# Patient Record
Sex: Female | Born: 1950 | Race: White | Hispanic: No | Smoking: Former smoker
Health system: Southern US, Community
[De-identification: ages and names within clinical notes are randomized; demographics above are authoritative.]

## PROBLEM LIST (undated history)

## (undated) DIAGNOSIS — H269 Unspecified cataract: Secondary | ICD-10-CM

## (undated) DIAGNOSIS — F329 Major depressive disorder, single episode, unspecified: Secondary | ICD-10-CM

## (undated) DIAGNOSIS — J349 Unspecified disorder of nose and nasal sinuses: Secondary | ICD-10-CM

## (undated) DIAGNOSIS — K635 Polyp of colon: Secondary | ICD-10-CM

## (undated) DIAGNOSIS — C801 Malignant (primary) neoplasm, unspecified: Secondary | ICD-10-CM

## (undated) DIAGNOSIS — E119 Type 2 diabetes mellitus without complications: Secondary | ICD-10-CM

## (undated) DIAGNOSIS — E079 Disorder of thyroid, unspecified: Secondary | ICD-10-CM

## (undated) DIAGNOSIS — F32A Depression, unspecified: Secondary | ICD-10-CM

## (undated) DIAGNOSIS — C44300 Unspecified malignant neoplasm of skin of unspecified part of face: Secondary | ICD-10-CM

## (undated) DIAGNOSIS — Z923 Personal history of irradiation: Secondary | ICD-10-CM

## (undated) DIAGNOSIS — D369 Benign neoplasm, unspecified site: Secondary | ICD-10-CM

## (undated) DIAGNOSIS — F419 Anxiety disorder, unspecified: Secondary | ICD-10-CM

## (undated) DIAGNOSIS — M1990 Unspecified osteoarthritis, unspecified site: Secondary | ICD-10-CM

## (undated) DIAGNOSIS — Z8489 Family history of other specified conditions: Secondary | ICD-10-CM

## (undated) DIAGNOSIS — G4730 Sleep apnea, unspecified: Secondary | ICD-10-CM

## (undated) DIAGNOSIS — H35049 Retinal micro-aneurysms, unspecified, unspecified eye: Secondary | ICD-10-CM

## (undated) DIAGNOSIS — E559 Vitamin D deficiency, unspecified: Secondary | ICD-10-CM

## (undated) DIAGNOSIS — E785 Hyperlipidemia, unspecified: Secondary | ICD-10-CM

## (undated) HISTORY — DX: Personal history of irradiation: Z92.3

## (undated) HISTORY — DX: Retinal micro-aneurysms, unspecified, unspecified eye: H35.049

## (undated) HISTORY — DX: Unspecified malignant neoplasm of skin of unspecified part of face: C44.300

## (undated) HISTORY — DX: Unspecified osteoarthritis, unspecified site: M19.90

## (undated) HISTORY — PX: TONSILECTOMY, ADENOIDECTOMY, BILATERAL MYRINGOTOMY AND TUBES: SHX2538

## (undated) HISTORY — PX: GASTRIC BYPASS: SHX52

## (undated) HISTORY — PX: ABDOMINAL HYSTERECTOMY: SHX81

## (undated) HISTORY — DX: Disorder of thyroid, unspecified: E07.9

## (undated) HISTORY — PX: MOHS SURGERY: SUR867

## (undated) HISTORY — DX: Benign neoplasm, unspecified site: D36.9

## (undated) HISTORY — PX: OTHER SURGICAL HISTORY: SHX169

## (undated) HISTORY — DX: Unspecified disorder of nose and nasal sinuses: J34.9

## (undated) HISTORY — DX: Polyp of colon: K63.5

## (undated) HISTORY — DX: Vitamin D deficiency, unspecified: E55.9

## (undated) HISTORY — DX: Hyperlipidemia, unspecified: E78.5

## (undated) HISTORY — PX: TUBAL LIGATION: SHX77

## (undated) HISTORY — DX: Unspecified cataract: H26.9

## (undated) HISTORY — PX: THYROIDECTOMY: SHX17

## (undated) HISTORY — DX: Type 2 diabetes mellitus without complications: E11.9

## (undated) HISTORY — PX: NASAL SINUS SURGERY: SHX719

---

## 1997-09-04 ENCOUNTER — Ambulatory Visit: Admission: RE | Admit: 1997-09-04 | Discharge: 1997-09-04 | Payer: Self-pay | Admitting: Otolaryngology

## 1997-10-09 ENCOUNTER — Observation Stay (HOSPITAL_COMMUNITY): Admission: RE | Admit: 1997-10-09 | Discharge: 1997-10-10 | Payer: Self-pay | Admitting: Otolaryngology

## 1998-02-15 HISTORY — PX: TONSILLECTOMY: SUR1361

## 2000-06-15 ENCOUNTER — Encounter: Admission: RE | Admit: 2000-06-15 | Discharge: 2000-09-13 | Payer: Self-pay | Admitting: *Deleted

## 2002-08-01 ENCOUNTER — Encounter: Payer: Self-pay | Admitting: Internal Medicine

## 2002-08-01 ENCOUNTER — Encounter: Admission: RE | Admit: 2002-08-01 | Discharge: 2002-08-01 | Payer: Self-pay | Admitting: Internal Medicine

## 2002-08-17 ENCOUNTER — Encounter (INDEPENDENT_AMBULATORY_CARE_PROVIDER_SITE_OTHER): Payer: Self-pay | Admitting: *Deleted

## 2002-08-17 ENCOUNTER — Ambulatory Visit (HOSPITAL_COMMUNITY): Admission: RE | Admit: 2002-08-17 | Discharge: 2002-08-17 | Payer: Self-pay | Admitting: Gastroenterology

## 2003-10-26 ENCOUNTER — Observation Stay (HOSPITAL_COMMUNITY): Admission: EM | Admit: 2003-10-26 | Discharge: 2003-10-27 | Payer: Self-pay | Admitting: Emergency Medicine

## 2004-04-01 ENCOUNTER — Encounter: Admission: RE | Admit: 2004-04-01 | Discharge: 2004-04-01 | Payer: Self-pay | Admitting: Internal Medicine

## 2004-07-23 ENCOUNTER — Encounter: Admission: RE | Admit: 2004-07-23 | Discharge: 2004-07-23 | Payer: Self-pay | Admitting: Internal Medicine

## 2004-07-31 ENCOUNTER — Encounter: Admission: RE | Admit: 2004-07-31 | Discharge: 2004-07-31 | Payer: Self-pay | Admitting: Internal Medicine

## 2004-08-05 ENCOUNTER — Encounter: Admission: RE | Admit: 2004-08-05 | Discharge: 2004-08-05 | Payer: Self-pay | Admitting: Internal Medicine

## 2004-08-06 ENCOUNTER — Encounter: Admission: RE | Admit: 2004-08-06 | Discharge: 2004-08-06 | Payer: Self-pay | Admitting: Internal Medicine

## 2004-09-17 ENCOUNTER — Encounter: Admission: RE | Admit: 2004-09-17 | Discharge: 2004-09-17 | Payer: Self-pay | Admitting: Internal Medicine

## 2004-10-27 ENCOUNTER — Encounter: Admission: RE | Admit: 2004-10-27 | Discharge: 2004-10-27 | Payer: Self-pay | Admitting: Internal Medicine

## 2007-04-21 ENCOUNTER — Emergency Department (HOSPITAL_COMMUNITY): Admission: EM | Admit: 2007-04-21 | Discharge: 2007-04-22 | Payer: Self-pay | Admitting: Emergency Medicine

## 2009-09-10 ENCOUNTER — Ambulatory Visit (HOSPITAL_COMMUNITY): Admission: RE | Admit: 2009-09-10 | Discharge: 2009-09-10 | Payer: Self-pay | Admitting: Surgery

## 2009-09-11 ENCOUNTER — Ambulatory Visit (HOSPITAL_COMMUNITY): Admission: RE | Admit: 2009-09-11 | Discharge: 2009-09-11 | Payer: Self-pay | Admitting: Surgery

## 2009-09-30 ENCOUNTER — Encounter: Admission: RE | Admit: 2009-09-30 | Discharge: 2009-11-13 | Payer: Self-pay | Admitting: Surgery

## 2009-09-30 ENCOUNTER — Ambulatory Visit (HOSPITAL_COMMUNITY): Admission: RE | Admit: 2009-09-30 | Discharge: 2009-09-30 | Payer: Self-pay | Admitting: Surgery

## 2009-11-13 ENCOUNTER — Inpatient Hospital Stay (HOSPITAL_COMMUNITY)
Admission: RE | Admit: 2009-11-13 | Discharge: 2009-11-16 | Payer: Self-pay | Source: Home / Self Care | Admitting: Surgery

## 2009-11-14 ENCOUNTER — Ambulatory Visit: Payer: Self-pay | Admitting: Vascular Surgery

## 2009-11-14 ENCOUNTER — Encounter (INDEPENDENT_AMBULATORY_CARE_PROVIDER_SITE_OTHER): Payer: Self-pay | Admitting: Surgery

## 2009-11-25 ENCOUNTER — Encounter
Admission: RE | Admit: 2009-11-25 | Discharge: 2010-01-20 | Payer: Self-pay | Source: Home / Self Care | Attending: Surgery | Admitting: Surgery

## 2010-02-24 ENCOUNTER — Encounter
Admission: RE | Admit: 2010-02-24 | Discharge: 2010-03-17 | Payer: Self-pay | Source: Home / Self Care | Attending: Surgery | Admitting: Surgery

## 2010-03-08 ENCOUNTER — Encounter: Payer: Self-pay | Admitting: Surgery

## 2010-03-08 ENCOUNTER — Encounter: Payer: Self-pay | Admitting: Internal Medicine

## 2010-04-23 ENCOUNTER — Encounter: Payer: PRIVATE HEALTH INSURANCE | Attending: Surgery | Admitting: *Deleted

## 2010-04-23 DIAGNOSIS — Z09 Encounter for follow-up examination after completed treatment for conditions other than malignant neoplasm: Secondary | ICD-10-CM | POA: Insufficient documentation

## 2010-04-23 DIAGNOSIS — Z713 Dietary counseling and surveillance: Secondary | ICD-10-CM | POA: Insufficient documentation

## 2010-04-23 DIAGNOSIS — Z9884 Bariatric surgery status: Secondary | ICD-10-CM | POA: Insufficient documentation

## 2010-04-30 LAB — DIFFERENTIAL
Basophils Absolute: 0 10*3/uL (ref 0.0–0.1)
Basophils Absolute: 0.1 10*3/uL (ref 0.0–0.1)
Eosinophils Absolute: 0.1 10*3/uL (ref 0.0–0.7)
Eosinophils Absolute: 0 10*3/uL (ref 0.0–0.7)
Eosinophils Relative: 0 % (ref 0–5)
Eosinophils Relative: 2 % (ref 0–5)
Lymphocytes Relative: 19 % (ref 12–46)
Lymphocytes Relative: 34 % (ref 12–46)
Lymphs Abs: 2.1 10*3/uL (ref 0.7–4.0)
Lymphs Abs: 1.7 10*3/uL (ref 0.7–4.0)
Lymphs Abs: 2.3 10*3/uL (ref 0.7–4.0)
Monocytes Absolute: 0.6 10*3/uL (ref 0.1–1.0)
Monocytes Absolute: 0.6 10*3/uL (ref 0.1–1.0)
Monocytes Relative: 8 % (ref 3–12)
Monocytes Relative: 8 % (ref 3–12)
Monocytes Relative: 8 % (ref 3–12)
Neutrophils Relative %: 63 % (ref 43–77)
Neutrophils Relative %: 73 % (ref 43–77)

## 2010-04-30 LAB — GLUCOSE, CAPILLARY
Glucose-Capillary: 114 mg/dL — ABNORMAL HIGH (ref 70–99)
Glucose-Capillary: 120 mg/dL — ABNORMAL HIGH (ref 70–99)
Glucose-Capillary: 123 mg/dL — ABNORMAL HIGH (ref 70–99)
Glucose-Capillary: 127 mg/dL — ABNORMAL HIGH (ref 70–99)
Glucose-Capillary: 119 mg/dL — ABNORMAL HIGH (ref 70–99)
Glucose-Capillary: 121 mg/dL — ABNORMAL HIGH (ref 70–99)
Glucose-Capillary: 125 mg/dL — ABNORMAL HIGH (ref 70–99)
Glucose-Capillary: 130 mg/dL — ABNORMAL HIGH (ref 70–99)
Glucose-Capillary: 132 mg/dL — ABNORMAL HIGH (ref 70–99)
Glucose-Capillary: 137 mg/dL — ABNORMAL HIGH (ref 70–99)
Glucose-Capillary: 139 mg/dL — ABNORMAL HIGH (ref 70–99)
Glucose-Capillary: 145 mg/dL — ABNORMAL HIGH (ref 70–99)
Glucose-Capillary: 149 mg/dL — ABNORMAL HIGH (ref 70–99)
Glucose-Capillary: 155 mg/dL — ABNORMAL HIGH (ref 70–99)
Glucose-Capillary: 159 mg/dL — ABNORMAL HIGH (ref 70–99)
Glucose-Capillary: 164 mg/dL — ABNORMAL HIGH (ref 70–99)
Glucose-Capillary: 172 mg/dL — ABNORMAL HIGH (ref 70–99)
Glucose-Capillary: 181 mg/dL — ABNORMAL HIGH (ref 70–99)

## 2010-04-30 LAB — CBC
HCT: 37.6 % (ref 36.0–46.0)
Hemoglobin: 11.3 g/dL — ABNORMAL LOW (ref 12.0–15.0)
Hemoglobin: 12.7 g/dL (ref 12.0–15.0)
MCH: 30.2 pg (ref 26.0–34.0)
MCH: 30.3 pg (ref 26.0–34.0)
MCHC: 34 g/dL (ref 30.0–36.0)
MCHC: 33.8 g/dL (ref 30.0–36.0)
MCV: 89.1 fL (ref 78.0–100.0)
MCV: 89.5 fL (ref 78.0–100.0)
Platelets: 315 10*3/uL (ref 150–400)
RBC: 3.74 MIL/uL — ABNORMAL LOW (ref 3.87–5.11)
RBC: 4.2 MIL/uL (ref 3.87–5.11)
RDW: 14.4 % (ref 11.5–15.5)
RDW: 13.8 % (ref 11.5–15.5)
WBC: 7 10*3/uL (ref 4.0–10.5)
WBC: 8.9 10*3/uL (ref 4.0–10.5)

## 2010-04-30 LAB — COMPREHENSIVE METABOLIC PANEL
AST: 18 U/L (ref 0–37)
Albumin: 4 g/dL (ref 3.5–5.2)
Chloride: 105 mEq/L (ref 96–112)
Creatinine, Ser: 0.58 mg/dL (ref 0.4–1.2)
GFR calc Af Amer: >60 mL/min (ref >60)
Total Bilirubin: 0.8 mg/dL (ref 0.3–1.2)

## 2010-04-30 LAB — SURGICAL PCR SCREEN
MRSA, PCR: NEGATIVE
Staphylococcus aureus: NEGATIVE

## 2010-04-30 LAB — HEMOGLOBIN AND HEMATOCRIT, BLOOD
HCT: 32.8 % — ABNORMAL LOW (ref 36.0–46.0)
HCT: 33 % — ABNORMAL LOW (ref 36.0–46.0)
Hemoglobin: 11.2 g/dL — ABNORMAL LOW (ref 12.0–15.0)
Hemoglobin: 11.2 g/dL — ABNORMAL LOW (ref 12.0–15.0)

## 2010-07-03 NOTE — Op Note (Signed)
Sherri Adams                          ACCOUNT NO.:  [id number]   MEDICAL RECORD NO.:  [id number]                   PATIENT TYPE:  OBV   LOCATION:  1823                                 FACILITY:  [facility]   PHYSICIAN:  Katy Fitch. Naaman Plummer., M.D.          DATE OF BIRTH:  01/26/1951   DATE OF PROCEDURE:  10/26/2003  DATE OF DISCHARGE:                                 OPERATIVE REPORT   PREOPERATIVE DIAGNOSIS:  Comminuted intra-articular fracture of right distal  radius, closed, displaced.   POSTOPERATIVE DIAGNOSIS:  Comminuted intra-articular fracture of right  distal radius, closed, displaced.   OPERATION:  Open reduction internal fixation of right radius utilizing 7 peg  DVR plate system.   OPERATING SURGEON:  Katy Fitch. Sypher, M.D., no assistant.   ANESTHESIA:  General by endotracheal technique; anesthesiologist is Dr.  Krista Blue.   INDICATIONS:  Sherri Adams is a 60 year old right hand dominant woman  referred by Dr. Ike Bene of the [facility] for  evaluation and management of a comminuted intra-articular fracture of the  right distal radius.   Reportedly, she fell at home earlier today while descending stairs. She  landed full weight bearing onto her hand and face. She had a minor abrasion  to her right cheek. She did struck her head but did not loose consciousness.   She was transported to the [facility] emergency room where she was evaluated  by Dr. Weldon Inches who made the diagnosis of a probable wrist fracture. X-rays  of her hand, wrist, forearm, and elbow region reportedly revealed a  comminuted displaced intra-articular fracture of the right distal radius.   An upper extremity orthopedic consult was requested.   After informed consent with Sherri Adams, her son and her sister who is a  Designer, jewellery, we recommended proceeding with open reduction internal  fixation of her wrist at this time.   The advantages of this procedure are obtaining  and maintaining anatomic  reduction of her radius as well as allowing her to mobilize her wrist in an  early fashion and avoiding the need for prolonged casting with the risks  attendant to prolonged casting.   After questions were invited and answered, she was brought to the operating  room at this time.   PROCEDURE:  Sherri Adams was brought to the operating room and placed in a  supine position on the operating room table.   Following the induction of general orotracheal anesthesia, the right arm was  prepped with Betadine solution and sterilely draped. A pneumatic tourniquet  was applied to proximal right brachium.   One gram of Ancef was administered as an IV prophylactic antibiotic.   Following exsanguination of the right arm with an Esmarch bandage, the  arterial tourniquet over the proximal brachium was inflated to 250 mmHg.   The procedure commenced with a standard DVR incision, paralleling the path  of the flexor carpal radialis tendon.  Subcutaneous tissues were carefully divided, taking care to identify the  transverse veins. These were clipped and suture ligated.   The flexor carpi radialis was retracted in an ulnar direction and the radial  artery and its accompanying veins in a radial direction. The fascia at the  floor of the FCR sheath was released, and the flexor pollicis longus  retracted in an ulnar direction.   The pronator quadratus was then elevated, exposing the fracture site.   The fracture was moderately comminuted. A locking bone clamp was used to  grasp the proximal shaft, rotated 90 degrees, and then elevator was used to  disimpact the articular fracture fragments.   The volar cortex was then anatomically reduced under direct vision.   A 7 peg DVR plate was placed with provisional fixation, and C-arm  fluoroscopy revealed plate to be slightly distal. Therefore, this was  readjusted into a more acceptable position.   A 12-mm screw was placed in  the cortex over the radial metaphysis to secure  the plate followed by placement of 7 threaded and smooth pegs. AP, lateral,  and oblique C-arm images were obtained during peg placement to control  length and proper position relative to the articular surface.   An anatomic reduction of the fracture was achieved, and good fixation  achieved with the pegs.   The remaining cortical screws were then filled with 12-mm screws x3 and the  10 mm in the most proximal hole.   The wound was then thoroughly lavaged with sterile saline followed by repair  of the pronator quadratus with a running suture of 0 Vicryl followed by  repair of the skin with subdermal sutures of 4-0 Vicryl and intradermal 3-0  Prolene with sterile strips.   The tourniquet was released for a total tourniquet time of approximately 54  minutes at 250 mmHg. Sherri Adams was then placed in a voluminous gauze and  sterile Webril dressing with sugar tong splint, maintaining the forearm in a  neutral rotation.   There were no apparent complications.   Sherri Adams tolerated the surgery and anesthesia well. She was transferred to  recovery room with stable vital signs.   Due to a background history of type 2 diabetes managed on metformin and  Actos as well as a history of asthma and sleep apnea, she will be observed  for 24 hours in the hospital to monitor vital signs and pulse oximetry  p.r.n.   We anticipate discharge with prescriptions for Percocet 5 mg 1or 2 tablets  p.o. q.4-6h. p.r.n. pain 30 tablets without refill. Also Keflex 500 mg 1  p.o. q.8h. for 4 days for prophylactic antibiotic and Motrin 600 mg 1 p.o.  q.6h. p.r.n. pain 30 tablets with 2 refills.                                               Katy Fitch Naaman Plummer., M.D.    RVS/MEDQ  D:  10/26/2003  T:  10/27/2003  Job:  [id number]   cc:   Ike Bene, M.D.  [address] [place]  [place] [address]  Fax: [phone]

## 2010-07-03 NOTE — Op Note (Signed)
   [name], Sherri Adams                          ACCOUNT NO.:  [id number]   MEDICAL RECORD NO.:  [id number]                   PATIENT TYPE:  AMB   LOCATION:  ENDO                                 FACILITY:  [facility]   PHYSICIAN:  Danise Edge, M.D.                DATE OF BIRTH:  August 23, 1950   DATE OF PROCEDURE:  08/17/2002  DATE OF DISCHARGE:                                 OPERATIVE REPORT   PROCEDURE:  Colonoscopy and polypectomy.   INDICATIONS:  Sherri Adams is a 60 year old female born February 07, 1951. Approximately  6 years ago Sherri Adams underwent  a colonoscopy and  neoplastic polyps were removed. Sherri Adams is scheduled to undergo a  screening colonoscopy with polypectomy to prevent colon cancer.   ENDOSCOPIST:  Danise Edge, M.D.   PREMEDICATION:  1. Versed 5 mg.  2. Demerol 50 mg.   DESCRIPTION OF PROCEDURE:  After informed consent was obtained Sherri Adams was  placed in the left lateral decubitus position. I administered intravenous  Demerol and intravenous Versed to achieve conscious sedation for the  procedure. The patient's blood pressure, O2 saturation and cardiac rhythm  were monitored throughout the procedure and documented in the medical  record.   Anal inspection was normal. Digital rectal exam was normal. The Olympus  pediatric video colonoscope was introduced into the rectum and advanced to  the cecum. Colonic preparation for the examination today was excellent.   Rectum:  Normal.  Sigmoid colon and descending colon:  At approximately  20 cm from the anal  verge, a  1-mm sessile polyp  was removed with the hot biopsy forceps.  Splenic flexure:  Normal.  Transverse colon:  Normal.  Hepatic flexure:  Normal.  Ascending colon:  Normal.  Cecum and ileocecal valve:  Normal.   ASSESSMENT:  A small  polyp  was removed from the distal sigmoid colon with  the hot biopsy forceps.   RECOMMENDATIONS:  Repeat colonoscopy in approximately 5 years.                                          Danise Edge, M.D.    MJ/MEDQ  D:  08/17/2002  T:  08/18/2002  Job:  [id number]

## 2010-07-21 ENCOUNTER — Ambulatory Visit: Payer: PRIVATE HEALTH INSURANCE | Admitting: *Deleted

## 2010-07-24 ENCOUNTER — Encounter: Payer: PRIVATE HEALTH INSURANCE | Attending: Surgery | Admitting: *Deleted

## 2010-07-24 DIAGNOSIS — Z9884 Bariatric surgery status: Secondary | ICD-10-CM | POA: Insufficient documentation

## 2010-07-24 DIAGNOSIS — Z09 Encounter for follow-up examination after completed treatment for conditions other than malignant neoplasm: Secondary | ICD-10-CM | POA: Insufficient documentation

## 2010-07-24 DIAGNOSIS — Z713 Dietary counseling and surveillance: Secondary | ICD-10-CM | POA: Insufficient documentation

## 2010-09-30 ENCOUNTER — Encounter (INDEPENDENT_AMBULATORY_CARE_PROVIDER_SITE_OTHER): Payer: Self-pay | Admitting: General Surgery

## 2010-10-02 ENCOUNTER — Encounter (INDEPENDENT_AMBULATORY_CARE_PROVIDER_SITE_OTHER): Payer: Self-pay | Admitting: Surgery

## 2010-10-02 ENCOUNTER — Ambulatory Visit (INDEPENDENT_AMBULATORY_CARE_PROVIDER_SITE_OTHER): Payer: PRIVATE HEALTH INSURANCE | Admitting: Surgery

## 2010-10-02 VITALS — BP 124/82 | HR 64 | Temp 97.4°F | Resp 18 | Ht 62.0 in | Wt 268.2 lb

## 2010-10-02 DIAGNOSIS — Z9884 Bariatric surgery status: Secondary | ICD-10-CM

## 2010-10-02 DIAGNOSIS — E119 Type 2 diabetes mellitus without complications: Secondary | ICD-10-CM

## 2010-10-02 DIAGNOSIS — I10 Essential (primary) hypertension: Secondary | ICD-10-CM

## 2010-10-02 DIAGNOSIS — J45909 Unspecified asthma, uncomplicated: Secondary | ICD-10-CM | POA: Insufficient documentation

## 2010-10-02 DIAGNOSIS — E7800 Pure hypercholesterolemia, unspecified: Secondary | ICD-10-CM | POA: Insufficient documentation

## 2010-10-02 NOTE — Progress Notes (Signed)
Sherri Adams is 10.7 months post op and has lost 82 lbs or 34% of her excess weight.  She looks and feels good.  She is off insulin shots and now only takes oral meds. She works out at [employer] 3 times a week. Her incisions have healed nicely. I will see her back in the office in 6 months. Dr. Trula Slade he's been doing a good job following up on her laboratory.

## 2010-10-29 ENCOUNTER — Encounter: Payer: PRIVATE HEALTH INSURANCE | Attending: Surgery | Admitting: *Deleted

## 2010-10-29 ENCOUNTER — Encounter: Payer: Self-pay | Admitting: *Deleted

## 2010-10-29 DIAGNOSIS — Z713 Dietary counseling and surveillance: Secondary | ICD-10-CM | POA: Insufficient documentation

## 2010-10-29 DIAGNOSIS — Z9884 Bariatric surgery status: Secondary | ICD-10-CM | POA: Insufficient documentation

## 2010-10-29 DIAGNOSIS — Z09 Encounter for follow-up examination after completed treatment for conditions other than malignant neoplasm: Secondary | ICD-10-CM | POA: Insufficient documentation

## 2010-10-29 NOTE — Patient Instructions (Addendum)
Goals:  Follow Phase 3B: High Protein + Non-Starchy Vegetables  Eat 3-6 small meals/snacks, every 3-5 hrs  Increase lean protein foods to meet 60-80g goal  Increase fluid intake to 64oz +  Add 15 grams of carbohydrate (fruit, whole grain, starchy vegetable) with meals (PRN)  Avoid drinking 15 minutes before, during and 30 minutes after eating  Aim for >30 min of physical activity daily

## 2010-10-29 NOTE — Progress Notes (Signed)
  Follow-up visit: 12 Months Post-Operative Gastric Bypass Surgery  Medical Nutrition Therapy:  Appt start time: 0800 end time:  0830.  Assessment:  Primary concerns today: post-operative bariatric surgery nutrition management. Sherri Adams reports for her year post-op visit today with no problems or complaints. She notes portion sizes are slightly larger and physical activity levels are a little less than what they have been.  Weight today: 274.6 lbs Weight change: 2.9 (277.5) Total weight lost: 79.4 lbs BMI: 50.3 Weight goal: 180 lbs % Weight goal met: 46%  24-hr recall:  B (8-9 AM): egg w/ cheese, sausage patty Snk (10  AM): 2 oz nuts   L (12-1 PM): Leftovers from dinner Snk (3-4 PM): cheese stick  D (6-8 PM): [employer]: 3 oz meat, 1/2 cup green vegetable Snk (PM): N/A  Fluid intake: 64 oz Estimated total protein intake: 60-70g  Medications: See updated medication list Supplementation: Taking mvi, calcium citrate, and B12  CBG monitoring: Daily Average CBG per patient: FBS: 90-100's Last patient reported A1c: No recent  Using straws: No Drinking while eating: No Hair loss: None reported Carbonated beverages: No N/V/D/C: None reported Dumping syndrome: One episode with fried foods  Recent physical activity:  Pt has been active at the [facility] in the past 3 months yet due to traveling across the country she has not been in her routine of going to [facility] (2-3 times/week, 45-60 minutes)  Progress Towards Goal(s):  In progress.   Nutritional Diagnosis:  [place]-3.3 Overweight/obesity As related to recent Gastric Bypass surgery.  As evidenced by pt following bariatric surgery dietary guidelines for continued weight loss.    Intervention:  Nutrition education.  Monitoring/Evaluation:  Dietary intake, exercise, lap band fills, and body weight. Follow up PRN for post-op visit.

## 2010-11-09 LAB — DIFFERENTIAL
Basophils Absolute: 0
Basophils Relative: 0
Eosinophils Relative: 0
Lymphocytes Relative: 13
Monocytes Absolute: 0.1
Monocytes Relative: 2 — ABNORMAL LOW

## 2010-11-09 LAB — I-STAT 8, (EC8 V) (CONVERTED LAB)
Acid-base deficit: 1
BUN: 16
Chloride: 102
HCT: 45
Operator id: 133351
Potassium: 4.7
pCO2, Ven: 34.3 — ABNORMAL LOW

## 2010-11-09 LAB — CBC
HCT: 41.5
Hemoglobin: 13.8
MCHC: 33.2
RBC: 4.83
RDW: 13.3

## 2011-03-11 ENCOUNTER — Encounter (INDEPENDENT_AMBULATORY_CARE_PROVIDER_SITE_OTHER): Payer: Self-pay | Admitting: Surgery

## 2012-04-02 ENCOUNTER — Encounter (HOSPITAL_BASED_OUTPATIENT_CLINIC_OR_DEPARTMENT_OTHER): Payer: Self-pay

## 2012-04-02 ENCOUNTER — Emergency Department (HOSPITAL_BASED_OUTPATIENT_CLINIC_OR_DEPARTMENT_OTHER)
Admission: EM | Admit: 2012-04-02 | Discharge: 2012-04-02 | Disposition: A | Discharge status: Home / Self Care | Payer: BC Managed Care – PPO | Attending: Emergency Medicine | Admitting: Emergency Medicine

## 2012-04-02 DIAGNOSIS — E079 Disorder of thyroid, unspecified: Secondary | ICD-10-CM | POA: Insufficient documentation

## 2012-04-02 DIAGNOSIS — Z87891 Personal history of nicotine dependence: Secondary | ICD-10-CM | POA: Insufficient documentation

## 2012-04-02 DIAGNOSIS — J329 Chronic sinusitis, unspecified: Secondary | ICD-10-CM | POA: Insufficient documentation

## 2012-04-02 DIAGNOSIS — J45909 Unspecified asthma, uncomplicated: Secondary | ICD-10-CM | POA: Insufficient documentation

## 2012-04-02 DIAGNOSIS — E785 Hyperlipidemia, unspecified: Secondary | ICD-10-CM | POA: Insufficient documentation

## 2012-04-02 DIAGNOSIS — M129 Arthropathy, unspecified: Secondary | ICD-10-CM | POA: Insufficient documentation

## 2012-04-02 DIAGNOSIS — D126 Benign neoplasm of colon, unspecified: Secondary | ICD-10-CM | POA: Insufficient documentation

## 2012-04-02 DIAGNOSIS — T169XXA Foreign body in ear, unspecified ear, initial encounter: Secondary | ICD-10-CM | POA: Insufficient documentation

## 2012-04-02 DIAGNOSIS — Y939 Activity, unspecified: Secondary | ICD-10-CM | POA: Insufficient documentation

## 2012-04-02 DIAGNOSIS — Y92009 Unspecified place in unspecified non-institutional (private) residence as the place of occurrence of the external cause: Secondary | ICD-10-CM | POA: Insufficient documentation

## 2012-04-02 DIAGNOSIS — W64XXXA Exposure to other animate mechanical forces, initial encounter: Secondary | ICD-10-CM | POA: Insufficient documentation

## 2012-04-02 MED ORDER — LIDOCAINE HCL 2 % IJ SOLN
INTRAMUSCULAR | Status: AC
  Filled 2012-04-02: qty 20

## 2012-04-02 NOTE — ED Notes (Signed)
Patient here with bug in left ear since am, bug visualized on arrival

## 2012-04-02 NOTE — ED Notes (Signed)
Pt visiably in pain and distress related to left ear pain from possible insect in ear. Insect was visualized using otoscope and lidocaine was instilled to ear canal for comfort and insect removed with suction

## 2012-04-02 NOTE — ED Provider Notes (Signed)
History     CSN: [id number]  Arrival date & time 04/02/12  1191   First MD Initiated Contact with Patient 04/02/12 [phone]      Chief Complaint  Patient presents with  . bug in ear     (Consider location/radiation/quality/duration/timing/severity/associated sxs/prior treatment) The history is provided by the patient.   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 Patient awoke this morning with an insect in her left ear canal. Came directly here for assistance. No other problems.  Past Medical History  Diagnosis Date  . Asthma   . Thyroid disease   . Hyperlipidemia   . Sinus problem   . Colon polyp   . Arthritis     Past Surgical History  Procedure Laterality Date  . Nasal sinus surgery      twice  . Thyroidectomy    . Abdominal hysterectomy    . Tubal ligation    . Tonsilectomy, adenoidectomy, bilateral myringotomy and tubes    . Arm plate      Plate in right arm. Bone was shattered after fall down flight of stairs.  . Cardiac bypass surgery    . Cataract surgery      right eye    No family history on file.  History  Substance Use Topics  . Smoking status: Former Smoker    Quit date: 10/02/2003  . Smokeless tobacco: Not on file  . Alcohol Use: No    OB History   Grav Para Term Preterm Abortions TAB SAB Ect Mult Living                  Review of Systems  Constitutional: Negative for fever.  HENT: Positive for ear pain. Negative for congestion.   Eyes: Negative for redness.  Respiratory: Negative for shortness of breath.   Cardiovascular: Negative for chest pain.  Gastrointestinal: Negative for nausea, vomiting and abdominal pain.  Genitourinary: Negative for dysuria.  Musculoskeletal: Negative for back pain.  Skin: Negative for rash.  Neurological: Negative for headaches.  Hematological: Does not bruise/bleed easily.    Allergies  Zithromax  Home Medications   Current Outpatient Rx  Name  Route  Sig  Dispense  Refill  . CALCIUM  PO   Oral   Take by mouth daily.           . Cholecalciferol (VITAMIN D PO)   Oral   Take by mouth daily.           Marland Kitchen glipiZIDE (GLUCOTROL) 10 MG tablet   Oral   Take 10 mg by mouth daily.           . Levothyroxine Sodium (SYNTHROID PO)   Oral   Take 1.25 mg by mouth daily.           . metFORMIN (GLUMETZA) 500 MG (MOD) 24 hr tablet   Oral   Take 500 mg by mouth 2 (two) times daily with a meal.           . Multiple Vitamin (MULTIVITAMIN PO)   Oral   Take by mouth daily.           . pioglitazone (ACTOS) 45 MG tablet   Oral   Take 45 mg by mouth daily.           . rosuvastatin (CRESTOR) 10 MG tablet   Oral   Take 10 mg by mouth daily.             BP 151/88  Pulse 79  Temp(Src) 97.7 F (36.5 C) (Oral)  Resp 23  SpO2 97%  Physical Exam  Nursing note and vitals reviewed. Constitutional: She is oriented to person, place, and time. She appears well-developed and well-nourished.  HENT:  Head: Normocephalic and atraumatic.  Right Ear: External ear normal.  Mouth/Throat: Oropharynx is clear and moist.  Left external canal with insect in the canal. Following removal no evidence of perforation of the tympanic membrane lobe hemorrhage the 10th membrane no bleeding the external canal.  Eyes: Conjunctivae and EOM are normal. Pupils are equal, round, and reactive to light.  Neck: Normal range of motion. Neck supple.  Cardiovascular: Normal rate, regular rhythm and normal heart sounds.   No murmur heard. Pulmonary/Chest: Effort normal and breath sounds normal.  Abdominal: Soft. Bowel sounds are normal.  Musculoskeletal: Normal range of motion.  Neurological: She is alert and oriented to person, place, and time. No cranial nerve deficit. She exhibits normal muscle tone. Coordination normal.  Skin: Skin is warm. No rash noted.    ED Course  Procedures (including critical care time)  Labs Reviewed - No data to display No results found.   1. Ear foreign  body       MDM   Insect in left ear canal. Current right prior to arrival. Insect was visualized was drowned the with lidocaine been removed with suction. Patient feels much better. A little bit of hemorrhage to the left tympanic membrane no evidence of perforation. No bleeding of the external canal.        Shelda Jakes, MD 04/02/12 0700

## 2012-04-17 ENCOUNTER — Other Ambulatory Visit: Payer: Self-pay | Admitting: Family Medicine

## 2012-04-17 ENCOUNTER — Ambulatory Visit
Admission: RE | Admit: 2012-04-17 | Discharge: 2012-04-17 | Disposition: A | Discharge status: Home / Self Care | Payer: BC Managed Care – PPO | Source: Ambulatory Visit | Attending: Family Medicine | Admitting: Family Medicine

## 2012-04-17 DIAGNOSIS — M25561 Pain in right knee: Secondary | ICD-10-CM

## 2012-04-17 DIAGNOSIS — M25562 Pain in left knee: Secondary | ICD-10-CM

## 2012-07-13 ENCOUNTER — Telehealth (INDEPENDENT_AMBULATORY_CARE_PROVIDER_SITE_OTHER): Payer: Self-pay | Admitting: Surgery

## 2012-07-13 NOTE — Telephone Encounter (Signed)
07/13/12 tried to reach patient, voicemail not set up - mailed recall letter to schedule a bariatric follow-up with Dr. Daphine Deutscher. Had RNY 11/13/09.

## 2012-11-14 ENCOUNTER — Encounter (INDEPENDENT_AMBULATORY_CARE_PROVIDER_SITE_OTHER): Payer: Self-pay

## 2013-04-15 DIAGNOSIS — H35049 Retinal micro-aneurysms, unspecified, unspecified eye: Secondary | ICD-10-CM

## 2013-04-15 HISTORY — DX: Retinal micro-aneurysms, unspecified, unspecified eye: H35.049

## 2013-10-16 DIAGNOSIS — D369 Benign neoplasm, unspecified site: Secondary | ICD-10-CM

## 2013-10-16 HISTORY — DX: Benign neoplasm, unspecified site: D36.9

## 2013-10-23 ENCOUNTER — Other Ambulatory Visit: Payer: Self-pay | Admitting: Gastroenterology

## 2013-10-25 ENCOUNTER — Encounter (HOSPITAL_COMMUNITY): Payer: Self-pay | Admitting: Pharmacy Technician

## 2013-10-25 ENCOUNTER — Other Ambulatory Visit: Payer: Self-pay | Admitting: Gastroenterology

## 2013-10-30 ENCOUNTER — Encounter (HOSPITAL_COMMUNITY): Payer: Self-pay | Admitting: *Deleted

## 2013-11-06 ENCOUNTER — Other Ambulatory Visit: Payer: Self-pay | Admitting: Gastroenterology

## 2013-11-07 ENCOUNTER — Encounter (HOSPITAL_COMMUNITY): Payer: BC Managed Care – PPO | Admitting: Anesthesiology

## 2013-11-07 ENCOUNTER — Encounter (HOSPITAL_COMMUNITY)
Admission: RE | Disposition: A | Discharge status: Home / Self Care | Payer: Self-pay | Source: Ambulatory Visit | Attending: Gastroenterology

## 2013-11-07 ENCOUNTER — Encounter (HOSPITAL_COMMUNITY): Payer: Self-pay | Admitting: *Deleted

## 2013-11-07 ENCOUNTER — Ambulatory Visit (HOSPITAL_COMMUNITY)
Admission: RE | Admit: 2013-11-07 | Discharge: 2013-11-07 | Disposition: A | Discharge status: Home / Self Care | Payer: BC Managed Care – PPO | Source: Ambulatory Visit | Attending: Gastroenterology | Admitting: Gastroenterology

## 2013-11-07 ENCOUNTER — Ambulatory Visit (HOSPITAL_COMMUNITY): Payer: BC Managed Care – PPO | Admitting: Anesthesiology

## 2013-11-07 DIAGNOSIS — Z923 Personal history of irradiation: Secondary | ICD-10-CM | POA: Diagnosis not present

## 2013-11-07 DIAGNOSIS — Z6841 Body Mass Index (BMI) 40.0 and over, adult: Secondary | ICD-10-CM | POA: Insufficient documentation

## 2013-11-07 DIAGNOSIS — Z860100 Personal history of colon polyps, unspecified: Secondary | ICD-10-CM | POA: Insufficient documentation

## 2013-11-07 DIAGNOSIS — I10 Essential (primary) hypertension: Secondary | ICD-10-CM | POA: Diagnosis not present

## 2013-11-07 DIAGNOSIS — Z85828 Personal history of other malignant neoplasm of skin: Secondary | ICD-10-CM | POA: Insufficient documentation

## 2013-11-07 DIAGNOSIS — Z1211 Encounter for screening for malignant neoplasm of colon: Secondary | ICD-10-CM | POA: Diagnosis present

## 2013-11-07 DIAGNOSIS — D126 Benign neoplasm of colon, unspecified: Secondary | ICD-10-CM | POA: Insufficient documentation

## 2013-11-07 DIAGNOSIS — I359 Nonrheumatic aortic valve disorder, unspecified: Secondary | ICD-10-CM | POA: Insufficient documentation

## 2013-11-07 DIAGNOSIS — Z9884 Bariatric surgery status: Secondary | ICD-10-CM | POA: Diagnosis not present

## 2013-11-07 DIAGNOSIS — J45909 Unspecified asthma, uncomplicated: Secondary | ICD-10-CM | POA: Diagnosis not present

## 2013-11-07 DIAGNOSIS — E119 Type 2 diabetes mellitus without complications: Secondary | ICD-10-CM | POA: Diagnosis not present

## 2013-11-07 DIAGNOSIS — Z87891 Personal history of nicotine dependence: Secondary | ICD-10-CM | POA: Diagnosis not present

## 2013-11-07 DIAGNOSIS — F3289 Other specified depressive episodes: Secondary | ICD-10-CM | POA: Diagnosis not present

## 2013-11-07 DIAGNOSIS — F329 Major depressive disorder, single episode, unspecified: Secondary | ICD-10-CM | POA: Insufficient documentation

## 2013-11-07 DIAGNOSIS — Z90710 Acquired absence of both cervix and uterus: Secondary | ICD-10-CM | POA: Insufficient documentation

## 2013-11-07 DIAGNOSIS — E039 Hypothyroidism, unspecified: Secondary | ICD-10-CM | POA: Diagnosis not present

## 2013-11-07 DIAGNOSIS — G4733 Obstructive sleep apnea (adult) (pediatric): Secondary | ICD-10-CM | POA: Diagnosis not present

## 2013-11-07 DIAGNOSIS — M1990 Unspecified osteoarthritis, unspecified site: Secondary | ICD-10-CM | POA: Diagnosis not present

## 2013-11-07 DIAGNOSIS — E7800 Pure hypercholesterolemia, unspecified: Secondary | ICD-10-CM | POA: Insufficient documentation

## 2013-11-07 HISTORY — DX: Depression, unspecified: F32.A

## 2013-11-07 HISTORY — DX: Anxiety disorder, unspecified: F41.9

## 2013-11-07 HISTORY — PX: COLONOSCOPY WITH PROPOFOL: SHX5780

## 2013-11-07 HISTORY — DX: Family history of other specified conditions: Z84.89

## 2013-11-07 HISTORY — DX: Malignant (primary) neoplasm, unspecified: C80.1

## 2013-11-07 HISTORY — DX: Sleep apnea, unspecified: G47.30

## 2013-11-07 HISTORY — DX: Major depressive disorder, single episode, unspecified: F32.9

## 2013-11-07 LAB — GLUCOSE, CAPILLARY: Glucose-Capillary: 151 mg/dL — ABNORMAL HIGH (ref 70–99)

## 2013-11-07 SURGERY — COLONOSCOPY WITH PROPOFOL
Anesthesia: Monitor Anesthesia Care

## 2013-11-07 MED ORDER — PROPOFOL 10 MG/ML IV EMUL
INTRAVENOUS | Status: DC | PRN
  Administered 2013-11-07: 40 mg via INTRAVENOUS

## 2013-11-07 MED ORDER — SODIUM CHLORIDE 0.9 % IV SOLN: INTRAVENOUS | Status: DC

## 2013-11-07 MED ORDER — PROPOFOL INFUSION 10 MG/ML OPTIME
INTRAVENOUS | Status: DC | PRN
  Administered 2013-11-07: 140 ug/kg/min via INTRAVENOUS

## 2013-11-07 MED ORDER — LACTATED RINGERS IV SOLN
INTRAVENOUS | Status: DC
Start: 2013-11-07 — End: 2013-11-07
  Administered 2013-11-07: 1000 mL via INTRAVENOUS

## 2013-11-07 MED ORDER — PROPOFOL 10 MG/ML IV BOLUS
INTRAVENOUS | Status: AC
  Filled 2013-11-07: qty 20

## 2013-11-07 MED ORDER — LIDOCAINE HCL (CARDIAC) 20 MG/ML IV SOLN
INTRAVENOUS | Status: AC
  Filled 2013-11-07: qty 5

## 2013-11-07 MED ORDER — LACTATED RINGERS IV SOLN
INTRAVENOUS | Status: DC | PRN
  Administered 2013-11-07: 13:00:00 via INTRAVENOUS

## 2013-11-07 SURGICAL SUPPLY — 22 items

## 2013-11-07 NOTE — Transfer of Care (Signed)
Immediate Anesthesia Transfer of Care Note  Patient: Sherri Adams  Procedure(s) Performed: Procedure(s): COLONOSCOPY WITH PROPOFOL (N/A)  Patient Location: PACU  Anesthesia Type:MAC  Level of Consciousness: awake, alert  and oriented  Airway & Oxygen Therapy: Patient Spontanous Breathing and Patient connected to face mask oxygen  Post-op Assessment: Report given to PACU RN and Post -op Vital signs reviewed and stable  Post vital signs: Reviewed and stable  Complications: No apparent anesthesia complications

## 2013-11-07 NOTE — H&P (Signed)
  Procedure: Surveillance colonoscopy. 09/04/2007 colonoscopy with removal of two 4 mm sigmoid colon tubular adenomatous polyps  History: The patient is a 63 year old female born [birth year]. She is scheduled to undergo a surveillance colonoscopy today. In July 2009, she underwent a colonoscopy with removal of 2 small tubular adenomatous sigmoid colon polyps.  Past medical history: Type 2 diabetes mellitus. Asthma. Hypercholesterolemia. Hypothyroidism post thyroidectomy. Obstructive sleep apnea syndrome. Gastric bypass surgery. Skin cancer surgery. Depression. Radiation to the tonsils at age 61 months. Mild aortic stenosis. Osteoarthritis. Rectocele. 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 Cataract surgery. Tubal ligation. Hysterectomy. Sinus surgery. Thyroidectomy. Right arm fracture surgery.  Allergies Lotensin. Zocor. Zithromax. Lipitor. Hydrocodone.  Exam: The patient is alert and lying comfortably on the endoscopy stretcher. Abdomen is soft and nontender to palpation. Lungs are clear to auscultation. Cardiac exam reveals a regular rhythm  Plan: Proceed with surveillance colonoscopy

## 2013-11-07 NOTE — Anesthesia Preprocedure Evaluation (Addendum)
Anesthesia Evaluation  Patient identified by MRN, date of birth, ID band Patient awake    Reviewed: Allergy & Precautions, H&P , NPO status , Patient's Chart, lab work & pertinent test results  Airway Mallampati: III TM Distance: >3 FB Neck ROM: full    Dental no notable dental hx. (+) Teeth Intact, Dental Advisory Given   Pulmonary asthma , sleep apnea , former smoker,  breath sounds clear to auscultation  Pulmonary exam normal       Cardiovascular Exercise Tolerance: Good hypertension, negative cardio ROS  Rhythm:regular Rate:Normal     Neuro/Psych negative neurological ROS  negative psych ROS   GI/Hepatic negative GI ROS, Neg liver ROS,   Endo/Other  diabetes, Well Controlled, Type 2, Insulin Dependent, Oral Hypoglycemic AgentsMorbid obesity  Renal/GU negative Renal ROS  negative genitourinary   Musculoskeletal   Abdominal (+) + obese,   Peds  Hematology negative hematology ROS (+)   Anesthesia Other Findings   Reproductive/Obstetrics negative OB ROS                        Anesthesia Physical Anesthesia Plan  ASA: III  Anesthesia Plan: MAC   Post-op Pain Management:    Induction:   Airway Management Planned:   Additional Equipment:   Intra-op Plan:   Post-operative Plan:   Informed Consent: I have reviewed the patients History and Physical, chart, labs and discussed the procedure including the risks, benefits and alternatives for the proposed anesthesia with the patient or authorized representative who has indicated his/her understanding and acceptance.   Dental Advisory Given  Plan Discussed with: CRNA and Surgeon  Anesthesia Plan Comments:         Anesthesia Quick Evaluation

## 2013-11-07 NOTE — Op Note (Signed)
Procedure: Surveillance colonoscopy. 09/04/2007 colonoscopy with removal of two 4 mm sigmoid colon tubular adenomatous polyps  Endoscopist: Earle Gell  Premedication: Propofol administered by anesthesia  Procedure: The patient was placed in the left lateral decubitus position. Anal inspection and digital rectal exam were normal. The Pentax pediatric colonoscope was introduced into the rectum and advanced to the cecum. A normal-appearing appendiceal orifice was identified. A normal-appearing ileocecal valve was intubated and the terminal ileum inspected. Colonic preparation for the exam today was good.  Rectum. Normal. Retroflexed view of the distal rectum normal  Sigmoid colon. From the mid sigmoid colon, a 5 mm sessile polyp was removed with the cold snare  Descending colon. Two 5 sessile polyps were removed with the cold snare.  Splenic flexure. Normal  Transverse colon. Normal  Hepatic flexure. Normal  Ascending colon. From the distal ascending colon, a 6 mm sessile polyp was removed with the hot snare.  Cecum and ileocecal valve. Normal  Terminal ileum. Normal  Assessment: A 5 mm sessile polyp was removed from the sigmoid colon, two 5 mm sessile polyps were removed from the descending colon, and a 6 mm sessile polyp was removed from the ascending. All polyps were submitted in one bottle for pathological evaluation   Recommendation: Await pathology report.

## 2013-11-08 ENCOUNTER — Encounter (HOSPITAL_COMMUNITY): Payer: Self-pay | Admitting: Gastroenterology

## 2013-11-08 NOTE — Anesthesia Postprocedure Evaluation (Signed)
  Anesthesia Post-op Note  Patient: Sherri Adams  Procedure(s) Performed: Procedure(s) (LRB): COLONOSCOPY WITH PROPOFOL (N/A)  Patient Location: PACU  Anesthesia Type: MAC  Level of Consciousness: awake and alert   Airway and Oxygen Therapy: Patient Spontanous Breathing  Post-op Pain: mild  Post-op Assessment: Post-op Vital signs reviewed, Patient's Cardiovascular Status Stable, Respiratory Function Stable, Patent Airway and No signs of Nausea or vomiting  Last Vitals:  Filed Vitals:   11/07/13 1450  BP: 96/70  Pulse: 59  Temp:   Resp: 18    Post-op Vital Signs: stable   Complications: No apparent anesthesia complications

## 2013-11-11 ENCOUNTER — Encounter (HOSPITAL_COMMUNITY): Admission: RE | Payer: Self-pay | Source: Ambulatory Visit

## 2013-11-11 ENCOUNTER — Ambulatory Visit (HOSPITAL_COMMUNITY)
Admission: RE | Admit: 2013-11-11 | Payer: BC Managed Care – PPO | Source: Ambulatory Visit | Admitting: Gastroenterology

## 2013-11-11 SURGERY — COLONOSCOPY WITH PROPOFOL
Anesthesia: Monitor Anesthesia Care

## 2014-07-17 ENCOUNTER — Other Ambulatory Visit (HOSPITAL_COMMUNITY): Payer: Self-pay | Admitting: Family Medicine

## 2014-07-17 ENCOUNTER — Ambulatory Visit (HOSPITAL_COMMUNITY)
Admission: RE | Admit: 2014-07-17 | Discharge: 2014-07-17 | Disposition: A | Discharge status: Home / Self Care | Payer: BLUE CROSS/BLUE SHIELD | Source: Ambulatory Visit | Attending: Family Medicine | Admitting: Family Medicine

## 2014-07-17 DIAGNOSIS — R609 Edema, unspecified: Secondary | ICD-10-CM | POA: Diagnosis not present

## 2014-07-17 DIAGNOSIS — M7989 Other specified soft tissue disorders: Secondary | ICD-10-CM | POA: Diagnosis present

## 2014-07-17 DIAGNOSIS — M79605 Pain in left leg: Secondary | ICD-10-CM

## 2014-07-17 LAB — HEMOGLOBIN A1C: Hgb A1c MFr Bld: 9.2 % — AB (ref 4.0–6.0)

## 2014-07-17 NOTE — Progress Notes (Signed)
VASCULAR LAB PRELIMINARY  PRELIMINARY  PRELIMINARY  PRELIMINARY  Left lower extremity venous duplex completed.    Preliminary report:  Left:  No evidence of DVT, superficial thrombosis, or Baker's cyst.  Roxana Lai, RVS 07/17/2014, 3:01 PM

## 2014-07-31 ENCOUNTER — Encounter: Payer: Self-pay | Admitting: *Deleted

## 2014-07-31 ENCOUNTER — Ambulatory Visit (INDEPENDENT_AMBULATORY_CARE_PROVIDER_SITE_OTHER): Payer: BLUE CROSS/BLUE SHIELD | Admitting: Podiatry

## 2014-07-31 DIAGNOSIS — M722 Plantar fascial fibromatosis: Secondary | ICD-10-CM

## 2014-07-31 DIAGNOSIS — M7672 Peroneal tendinitis, left leg: Secondary | ICD-10-CM

## 2014-07-31 DIAGNOSIS — S93602A Unspecified sprain of left foot, initial encounter: Secondary | ICD-10-CM

## 2014-07-31 NOTE — Progress Notes (Signed)
Subjective:     Patient ID: Sherri Adams, female   DOB: [birth year], 64 y.o.   MRN: [id number]  HPIThis patient presents to the office with left foot pain.Pain has been present for 2 months and goes around the outside of left ankle and up the outside of her left foot. She has significant swelling in her left foot.     Review of Systems     Objective:   Physical Exam Objective: Review of past medical history, medications, social history and allergies were performed.  Vascular: Dorsalis pedis and posterior tibial pulses were palpable B/L, capillary refill was  WNL B/L, temperature gradient was WNL B/L   Skin:  No signs of symptoms of infection or ulcers on both feet  Nails: appear healthy with no signs of mycosis or infections  Sensory: Semmes Weinstein monifilament WNL   Orthopedic: Orthopedic evaluation demonstrates all joints distal t ankle have full ROM without crepitus, muscle power WNL B/L.  She has noticeable guarding left foot and ankle.  Pain along peroneal tendon left foot.  Palpable sinus tarsi pain left ankle.     Assessment:     Foot Sprain left foot.  2. peroneal tendinitis left foot.   Plan:     IE>  X-ray.  Injection therapy.  Purestride insoles.

## 2014-07-31 NOTE — Addendum Note (Signed)
Addended by: Cranford Mon R on: 07/31/2014 02:26 PM   Modules accepted: Medications

## 2014-08-14 ENCOUNTER — Ambulatory Visit (INDEPENDENT_AMBULATORY_CARE_PROVIDER_SITE_OTHER): Payer: BLUE CROSS/BLUE SHIELD | Admitting: Podiatry

## 2014-08-14 DIAGNOSIS — S93602A Unspecified sprain of left foot, initial encounter: Secondary | ICD-10-CM

## 2014-08-14 NOTE — Progress Notes (Signed)
Subjective:     Patient ID: Sherri Adams, female   DOB: November 09, 1950, 64 y.o.   MRN: [id number]  HPIShe says her foot is markedly improved since her last visit.  She was diagnosed with sinus tarsi/ foot sprain with injection therapy and purestrides were dispensed.  She is 90% improved.   Review of Systems     Objective:   Physical Exam Objective: Review of past medical history, medications, social history and allergies were performed.  Vascular: Dorsalis pedis and posterior tibial pulses were palpable B/L, capillary refill was  WNL B/L, temperature gradient was WNL B/L   Skin:  No signs of symptoms of infection or ulcers on both feet  Nails: appear healthy with no signs of mycosis or infections  Sensory: Semmes Weinstein monifilament WNL   Orthopedic: Orthopedic evaluation demonstrates all joints distal t ankle have full ROM without crepitus, muscle power WNL B/L.  Significant swelling ankles B/L.  Pain has diminished in sinus tarsi left foot.    Assessment:    Foot Sprain     Plan:     ROV  Continue with purestride insoles

## 2014-09-06 ENCOUNTER — Ambulatory Visit (INDEPENDENT_AMBULATORY_CARE_PROVIDER_SITE_OTHER): Payer: BLUE CROSS/BLUE SHIELD | Admitting: Internal Medicine

## 2014-09-06 ENCOUNTER — Encounter: Payer: Self-pay | Admitting: Internal Medicine

## 2014-09-06 VITALS — BP 122/60 | HR 92 | Temp 98.0°F | Resp 14 | Ht 62.0 in | Wt 280.0 lb

## 2014-09-06 DIAGNOSIS — E1165 Type 2 diabetes mellitus with hyperglycemia: Secondary | ICD-10-CM | POA: Diagnosis not present

## 2014-09-06 MED ORDER — METFORMIN HCL 1000 MG PO TABS: 1000.0000 mg | ORAL_TABLET | Freq: Two times a day (BID) | ORAL | Status: DC

## 2014-09-06 MED ORDER — SITAGLIPTIN PHOSPHATE 100 MG PO TABS: 100.0000 mg | ORAL_TABLET | Freq: Every day | ORAL | Status: DC

## 2014-09-06 NOTE — Patient Instructions (Signed)
Please stay on Lantus 40 units at bedtime. Continue Metformin 1000 mg 2x a day with meals. Start Januvia 100 mg in am, before b'fast.  Please return in 1.5 months with your sugar log.   PATIENT INSTRUCTIONS FOR TYPE 2 DIABETES:  **Please join MyChart!** - see attached instructions about how to join if you have not done so already.  DIET AND EXERCISE Diet and exercise is an important part of diabetic treatment.  We recommended aerobic exercise in the form of brisk walking (working between 40-60% of maximal aerobic capacity, similar to brisk walking) for 150 minutes per week (such as 30 minutes five days per week) along with 3 times per week performing 'resistance' training (using various gauge rubber tubes with handles) 5-10 exercises involving the major muscle groups (upper body, lower body and core) performing 10-15 repetitions (or near fatigue) each exercise. Start at half the above goal but build slowly to reach the above goals. If limited by weight, joint pain, or disability, we recommend daily walking in a swimming pool with water up to waist to reduce pressure from joints while allow for adequate exercise.    BLOOD GLUCOSES Monitoring your blood glucoses is important for continued management of your diabetes. Please check your blood glucoses 2-4 times a day: fasting, before meals and at bedtime (you can rotate these measurements - e.g. one day check before the 3 meals, the next day check before 2 of the meals and before bedtime, etc.).   HYPOGLYCEMIA (low blood sugar) Hypoglycemia is usually a reaction to not eating, exercising, or taking too much insulin/ other diabetes drugs.  Symptoms include tremors, sweating, hunger, confusion, headache, etc. Treat IMMEDIATELY with 15 grams of Carbs: . 4 glucose tablets .  cup regular juice/soda . 2 tablespoons raisins . 4 teaspoons sugar . 1 tablespoon honey Recheck blood glucose in 15 mins and repeat above if still symptomatic/blood glucose  <100.  RECOMMENDATIONS TO REDUCE YOUR RISK OF DIABETIC COMPLICATIONS: * Take your prescribed MEDICATION(S) * Follow a DIABETIC diet: Complex carbs, fiber rich foods, (monounsaturated and polyunsaturated) fats * AVOID saturated/trans fats, high fat foods, >2,300 mg salt per day. * EXERCISE at least 5 times a week for 30 minutes or preferably daily.  * DO NOT SMOKE OR DRINK more than 1 drink a day. * Check your FEET every day. Do not wear tightfitting shoes. Contact us if you develop an ulcer * See your EYE doctor once a year or more if needed * Get a FLU shot once a year * Get a PNEUMONIA vaccine once before and once after age 23 years  GOALS:  * Your Hemoglobin A1c of <7%  * fasting sugars need to be <130 * after meals sugars need to be <180 (2h after you start eating) * Your Systolic BP should be 235 or lower  * Your Diastolic BP should be 80 or lower  * Your HDL (Good Cholesterol) should be 40 or higher  * Your LDL (Bad Cholesterol) should be 100 or lower. * Your Triglycerides should be 150 or lower  * Your Urine microalbumin (kidney function) should be <30 * Your Body Mass Index should be 25 or lower    Please consider the following ways to cut down carbs and fat and increase fiber and micronutrients in your diet: - substitute whole grain for white bread or pasta - substitute brown rice for white rice - substitute 90-calorie flat bread pieces for slices of bread when possible - substitute sweet potatoes or yams  for white potatoes - substitute humus for margarine - substitute tofu for cheese when possible - substitute almond or rice milk for regular milk (would not drink soy milk daily due to concern for soy estrogen influence on breast cancer risk) - substitute dark chocolate for other sweets when possible - substitute water - can add lemon or orange slices for taste - for diet sodas (artificial sweeteners will trick your body that you can eat sweets without getting calories and  will lead you to overeating and weight gain in the long run) - do not skip breakfast or other meals (this will slow down the metabolism and will result in more weight gain over time)  - can try smoothies made from fruit and almond/rice milk in am instead of regular breakfast - can also try old-fashioned (not instant) oatmeal made with almond/rice milk in am - order the dressing on the side when eating salad at a restaurant (pour less than half of the dressing on the salad) - eat as little meat as possible - can try juicing, but should not forget that juicing will get rid of the fiber, so would alternate with eating raw veg./fruits or drinking smoothies - use as little oil as possible, even when using olive oil - can dress a salad with a mix of balsamic vinegar and lemon juice, for e.g. - use agave nectar, stevia sugar, or regular sugar rather than artificial sweateners - steam or broil/roast veggies  - snack on veggies/fruit/nuts (unsalted, preferably) when possible, rather than processed foods - reduce or eliminate aspartame in diet (it is in diet sodas, chewing gum, etc) Read the labels!  Try to read Dr. Janene Harvey book: "Program for Reversing Diabetes" for other ideas for healthy eating.

## 2014-09-06 NOTE — Progress Notes (Signed)
Patient ID: Sherri Adams, female   DOB: 02/02/1951, 64 y.o.   MRN: [id number]  HPI: Sherri Adams is a 64 y.o.-year-old female, referred by her PCP, Dr. Harlan Stains, for management of DM2, dx in 2006, insulin-dependent since 2013 (was off for 1 years after RenY), uncontrolled, without complications.  Last hemoglobin A1c was: Lab Results  Component Value Date   HGBA1C 9.2* 07/17/2014   HGBA1C * 11/13/2009    8.6 (NOTE)                                                                       According to the ADA Clinical Practice Recommendations for 2011, when HbA1c is used as a screening test:   >=6.5%   Diagnostic of Diabetes Mellitus           (if abnormal result  is confirmed)  5.7-6.4%   Increased risk of developing Diabetes Mellitus  References:Diagnosis and Classification of Diabetes Mellitus,Diabetes NLZJ,6734,19(FXTKW 1):S62-S69 and Standards of Medical Care in         Diabetes - 2011,Diabetes Care,2011,34  (Suppl 1):S11-S61.   Pt is on a regimen of: - Metformin 1000 mg 2x a day, with meals >> tolerated well  - Lantus 40 units at bedtime and 10 units in am (1-2 a week) She was on Actos.  She was Invokana >> yeast inf's >> stopped  Pt checks her sugars 1 a day and they are: - am: 128, 175-199 - 2h after b'fast: n/c - before lunch: n/c - 2h after lunch: n/c - before dinner: n/c - 2h after dinner: n/c - bedtime: n/c - nighttime: n/c No lows. Lowest sugar was 128; ? if she has hypoglycemia awareness.  Highest sugar was 199.  Glucometer: ReliOn  Pt's meals are: - Breakfast: Adams muffin + cheese; hashbrowns, eggs + meat - Lunch: frozen dinner,sandwich - Dinner: meat + salad, no starch usually - Snacks: nuts, cheese, watermelon- 4x a day  - no CKD, last BUN/creatinine:  08/01/2014:19/0.76 Lab Results  Component Value Date   BUN 16 11/12/2009   CREATININE 0.58 11/12/2009  Not on ACEIs.  - last set of lipids: 08/01/2014: 150/151/43/77 On Crestor 20 mg daily. -  last eye exam was in 10/2013 No DR. She had cataract sxs. 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 - no numbness and tingling in her feet.  Pt has FH of DM in father, sisters, daughter.  She also has hypothyroidism, HL, depression.  ROS: Constitutional: no weight gain/loss, + fatigue, no subjective hyperthermia/hypothermia, + nocturia, + poor sleep Eyes: no blurry vision, no xerophthalmia ENT: no sore throat, no nodules palpated in throat, no dysphagia/odynophagia, no hoarseness, + decreased hearing Cardiovascular: no CP/SOB/palpitations/+ leg swelling Respiratory: + cough/no SOB/+ wheezing Gastrointestinal: no N/V/D/C Musculoskeletal: no muscle/+ joint aches Skin: no rashes, + easy bruising, + hair loss Neurological: no tremors/numbness/tingling/dizziness Psychiatric: + depression/no anxiety  Past Medical History  Diagnosis Date  . Thyroid disease   . Hyperlipidemia   . Sinus problem   . Colon polyp   . Arthritis   . Asthma     "perfumes, strong odors"  . Anxiety   . Depression   . Cancer     20 yrs ago - skin  cancer -basal cell  . Sleep apnea     no cpap  . Family history of anesthesia complication     mother slow to wake  . Diabetes mellitus without complication   . Sleep apnea   . Cataracts, bilateral   . Skin cancer of face   . Vitamin D deficiency   . 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 History of radiation therapy     tonsils: 74 months of age  . Adenomatous polyps 9/15   Past Surgical History  Procedure Laterality Date  . Nasal sinus surgery      twice  . Thyroidectomy    . Abdominal hysterectomy    . Tubal ligation    . Tonsilectomy, adenoidectomy, bilateral myringotomy and tubes    . Arm plate      Plate in right arm. Bone was shattered after fall down flight of stairs.  . Cataract surgery Bilateral     right eye  . Gastric bypass      4 yrs ago weight around 250# now and steady  . Tonsillectomy  2000    and adenoids  . Colonoscopy with propofol  N/A 11/07/2013    Procedure: COLONOSCOPY WITH PROPOFOL;  Surgeon: Garlan Fair, MD;  Location: [facility];  Service: Endoscopy;  Laterality: N/A;   History   Social History  . Marital Status: Divorced   Occupational History  . Sales adm.   Social History Main Topics  . Smoking status: Former Smoker    Quit date: 10/02/2003  . Smokeless tobacco: Not on file  . Alcohol Use: 0.0 oz/week    0 Standard drinks or equivalent per week     Comment: very rare  . Drug Use: No   Social History Narrative   Divorced   4 adult children   9 grandchildren   Occupation: Equities trader [employer]      Current Outpatient Prescriptions on File Prior to Visit  Medication Sig Dispense Refill  . aspirin EC 81 MG tablet Take 81 mg by mouth every morning.    Marland Kitchen CRESTOR 20 MG tablet Take 20 mg by mouth daily.   0  . fluticasone (FLONASE) 50 MCG/ACT nasal spray   0  . LANTUS SOLOSTAR 100 UNIT/ML Solostar Pen Inject 40 Units into the skin.   1  . levothyroxine (SYNTHROID, LEVOTHROID) 112 MCG tablet Take 112 mcg by mouth daily before breakfast.    . meloxicam (MOBIC) 15 MG tablet Take 15 mg by mouth daily.   0  . metFORMIN (GLUCOPHAGE) 500 MG tablet Take 1,000 mg by mouth 2 (two) times daily with a meal.   0  . Multiple Vitamin (MULTIVITAMIN PO) Take 1 tablet by mouth every morning.     . rosuvastatin (CRESTOR) 10 MG tablet Take 10 mg by mouth at bedtime.     . fluconazole (DIFLUCAN) 150 MG tablet   0  . insulin glargine (LANTUS) 100 UNIT/ML injection Inject 24 Units into the skin at bedtime.     No current facility-administered medications on file prior to visit.   Allergies  Allergen Reactions  . Hydrocodone   . Lipitor [Atorvastatin]   . Lotensin [Benazepril Hcl]   . Zithromax [Azithromycin] Itching  . Zocor [Simvastatin]    Family History  Problem Relation Age of Onset  . COPD Father   . Myasthenia gravis Father   . Cancer Father     colon  . Diabetes Father   . Heart  disease Father   . Heart  disease Mother     CAD  . Alzheimer's disease Mother   . CAD Mother   . Cancer Maternal Grandfather     Brain tumor  . Diabetes Sister   . Deep vein thrombosis Sister   . Obesity Sister   . Thyroid cancer Daughter   . Obesity Sister   . Hypertension Sister   . Obesity Sister   . Thyroid disease Sister   . Hypertension Sister   . Diabetes Sister    PE: BP 122/60 mmHg  Pulse 92  Temp(Src) 98 F (36.7 C) (Oral)  Resp 14  Ht 5\' 2"  (1.575 m)  Wt 280 lb (127.007 kg)  BMI 51.20 kg/m2  SpO2 95% Wt Readings from Last 3 Encounters:  09/06/14 280 lb (127.007 kg)  11/07/13 270 lb (122.471 kg)  10/29/10 274 lb 9.6 oz (124.558 kg)   Constitutional: obese, in NAD Eyes: PERRLA, EOMI, no exophthalmos ENT: moist mucous membranes, no thyromegaly, no cervical lymphadenopathy Cardiovascular: tachycardia, RR, No MRG, + B lymphedema L>R Respiratory: CTA B Gastrointestinal: abdomen soft, NT, ND, BS+ Musculoskeletal: no deformities, strength intact in all 4 Skin: moist, warm, no rashes Neurological: no tremor with outstretched hands, DTR normal in all 4  ASSESSMENT: 1. DM2, insulin-dependent, uncontrolled, without complications  PLAN:  1. Patient with long-standing, uncontrolled diabetes, on oral antidiabetic regimen + basal insulin, which became insufficient - We discussed about options for treatment, and I suggested to add Januvia  - may also try Trulicity/Tanzeum or Jardiance at next visit if this is not enough, but will need sugar readings later in the day, too. Would like to avoid meds that can exacerbate weight gain:  Patient Instructions  Please stay on Lantus 40 units at bedtime. Continue Metformin 1000 mg 2x a day with meals. Start Januvia 100 mg in am, before b'fast.  Please return in 1.5 months with your sugar log.   - Strongly advised her to start checking sugars at different times of the day - check 2 times a day, rotating checks - given sugar log  and advised how to fill it and to bring it at next appt  - given foot care handout and explained the principles  - given instructions for hypoglycemia management "15-15 rule"  - advised for yearly eye exams - Return to clinic in 1.5 mo with sugar log

## 2014-10-29 ENCOUNTER — Ambulatory Visit: Payer: BLUE CROSS/BLUE SHIELD | Admitting: Internal Medicine

## 2014-12-02 ENCOUNTER — Other Ambulatory Visit (INDEPENDENT_AMBULATORY_CARE_PROVIDER_SITE_OTHER): Payer: BLUE CROSS/BLUE SHIELD | Admitting: *Deleted

## 2014-12-02 ENCOUNTER — Ambulatory Visit (INDEPENDENT_AMBULATORY_CARE_PROVIDER_SITE_OTHER): Payer: BLUE CROSS/BLUE SHIELD | Admitting: Internal Medicine

## 2014-12-02 ENCOUNTER — Encounter: Payer: Self-pay | Admitting: Internal Medicine

## 2014-12-02 VITALS — BP 118/64 | HR 85 | Temp 97.6°F | Resp 12 | Wt 278.6 lb

## 2014-12-02 DIAGNOSIS — E1165 Type 2 diabetes mellitus with hyperglycemia: Secondary | ICD-10-CM

## 2014-12-02 DIAGNOSIS — Z794 Long term (current) use of insulin: Secondary | ICD-10-CM | POA: Diagnosis not present

## 2014-12-02 DIAGNOSIS — E785 Hyperlipidemia, unspecified: Secondary | ICD-10-CM | POA: Diagnosis not present

## 2014-12-02 LAB — POCT GLYCOSYLATED HEMOGLOBIN (HGB A1C): Hemoglobin A1C: 8.5

## 2014-12-02 NOTE — Patient Instructions (Addendum)
Please continue: - Lantus 40 units at bedtime  - Metformin 1000 mg 2x a day, with meals  - Januvia 100 mg daily in am   Please return in 3 months with your sugar log.   Please schedule a new eye exam.  KEEP UP THE GREAT WORK!

## 2014-12-02 NOTE — Progress Notes (Signed)
Patient ID: Sherri Adams, female   DOB: Dec 01, 1950, 64 y.o.   MRN: [id number]  HPI: Sherri Adams is a 64 y.o.-year-old female,returning for f/u for DM2, dx in 2006, insulin-dependent since 2013 (was off for 1 years after RenY), uncontrolled, without complications. Last visit 2 mo ago.  She started to walking more and watching her diet.  Last hemoglobin A1c was: Lab Results  Component Value Date   HGBA1C 9.2* 07/17/2014   HGBA1C * 11/13/2009    8.6 (NOTE)                                                                       According to the ADA Clinical Practice Recommendations for 2011, when HbA1c is used as a screening test:   >=6.5%   Diagnostic of Diabetes Mellitus           (if abnormal result  is confirmed)  5.7-6.4%   Increased risk of developing Diabetes Mellitus  References:Diagnosis and Classification of Diabetes Mellitus,Diabetes JGOT,1572,62(MBTDH 1):S62-S69 and Standards of Medical Care in         Diabetes - 2011,Diabetes Care,2011,34  (Suppl 1):S11-S61.   Pt is on a regimen of: - Metformin 1000 mg 2x a day, with meals >> tolerated well  - Lantus 40 units at bedtime  - Januvia 100 mg daily in am - added 08/2014 She was on Actos.  She was Invokana >> yeast inf's >> stopped  Pt checks her sugars 1x a day and they are MUCH improved: - am: 128, 175-199 >> 86, 93-120 - 2h after b'fast: n/c - before lunch: n/c - 2h after lunch: n/c - before dinner: n/c - 2h after dinner:140-150 - bedtime: n/c - nighttime: n/c No lows. Lowest sugar was 128 >> 86; ? if she has hypoglycemia awareness.  Highest sugar was 199 >> 176.  Glucometer: ReliOn  Pt's meals are: - Breakfast: Adams muffin + cheese; hashbrowns, eggs + meat - Lunch: frozen dinner,sandwich - Dinner: meat + salad, no starch usually - Snacks: nuts, cheese  - no CKD, last BUN/creatinine:  08/01/2014:19/0.76 Lab Results  Component Value Date   BUN 16 11/12/2009   CREATININE 0.58 11/12/2009  Not on ACEIs.  -  last set of lipids: 08/01/2014: 150/151/43/77 On Crestor 20 mg daily. - last eye exam was in 10/2013. No DR. She had cataract sxs. 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 - no numbness and tingling in her feet.  She also has hypothyroidism, HL, depression.  ROS: Constitutional: no weight gain/loss, no fatigue, no subjective hyperthermia/hypothermia Eyes: no blurry vision, no xerophthalmia ENT: no sore throat, no nodules palpated in throat, no dysphagia/odynophagia, no hoarseness, + decreased hearing Cardiovascular: no CP/SOB/palpitations/+ leg swelling L>R Respiratory: no cough/no SOB/wheezing Gastrointestinal: no N/V/D/C Musculoskeletal: no muscle/+ joint aches Skin: no rashes, + easy bruising, + hair loss Neurological: no tremors/numbness/tingling/dizziness  I reviewed pt's medications, allergies, PMH, social hx, family hx, and changes were documented in the history of present illness. Otherwise, unchanged from my initial visit note: Past Medical History  Diagnosis Date  . Thyroid disease   . Hyperlipidemia   . Sinus problem   . Colon polyp   . Arthritis   . Asthma     "  perfumes, strong odors"  . Anxiety   . Depression   . Cancer ([place])     20 yrs ago - skin cancer -basal cell  . Sleep apnea     no cpap  . Family history of anesthesia complication     mother slow to wake  . Diabetes mellitus without complication ([place])   . Sleep apnea   . Cataracts, bilateral   . Skin cancer of face   . Vitamin D deficiency   . 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 History of radiation therapy     tonsils: 20 months of age  . Adenomatous polyps 9/15   Past Surgical History  Procedure Laterality Date  . Nasal sinus surgery      twice  . Thyroidectomy    . Abdominal hysterectomy    . Tubal ligation    . Tonsilectomy, adenoidectomy, bilateral myringotomy and tubes    . Arm plate      Plate in right arm. Bone was shattered after fall down flight of stairs.  . Cataract surgery  Bilateral     right eye  . Gastric bypass      4 yrs ago weight around 250# now and steady  . Tonsillectomy  2000    and adenoids  . Colonoscopy with propofol N/A 11/07/2013    Procedure: COLONOSCOPY WITH PROPOFOL;  Surgeon: Garlan Fair, MD;  Location: [facility];  Service: Endoscopy;  Laterality: N/A;   History   Social History  . Marital Status: Divorced   Occupational History  . Sales adm.   Social History Main Topics  . Smoking status: Former Smoker    Quit date: 10/02/2003  . Smokeless tobacco: Not on file  . Alcohol Use: 0.0 oz/week    0 Standard drinks or equivalent per week     Comment: very rare  . Drug Use: No   Social History Narrative   Divorced   4 adult children   9 grandchildren   Occupation: Equities trader [employer]      Current Outpatient Prescriptions on File Prior to Visit  Medication Sig Dispense Refill  . albuterol (PROVENTIL HFA;VENTOLIN HFA) 108 (90 BASE) MCG/ACT inhaler Inhale 2 puffs into the lungs every 6 (six) hours as needed for wheezing or shortness of breath.    Marland Kitchen aspirin EC 81 MG tablet Take 81 mg by mouth every morning.    Marland Kitchen buPROPion (WELLBUTRIN XL) 300 MG 24 hr tablet Take 300 mg by mouth daily.    . Cholecalciferol (VITAMIN D3) 5000 UNITS CAPS Take 1 capsule by mouth daily.    . cyanocobalamin 500 MCG tablet Take 500 mcg by mouth daily.    . fluconazole (DIFLUCAN) 150 MG tablet   0  . fluticasone (FLONASE) 50 MCG/ACT nasal spray   0  . furosemide (LASIX) 40 MG tablet Take 40 mg by mouth daily.    Marland Kitchen glucose blood (FREESTYLE LITE) test strip 1 each by Other route. Use as instructed    . Insulin Pen Needle (CAREFINE PEN NEEDLES) 32G X 4 MM MISC 1 each by Does not apply route daily.    Marland Kitchen LANTUS SOLOSTAR 100 UNIT/ML Solostar Pen Inject 40 Units into the skin.   1  . levothyroxine (SYNTHROID, LEVOTHROID) 112 MCG tablet Take 112 mcg by mouth daily before breakfast.    . meloxicam (MOBIC) 15 MG tablet Take 15 mg by mouth daily.    0  . metFORMIN (GLUCOPHAGE) 1000 MG tablet Take 1 tablet (1,000 mg total) by  mouth 2 (two) times daily with a meal. 180 tablet 1  . Multiple Vitamin (MULTIVITAMIN PO) Take 1 tablet by mouth every morning.     . Multiple Vitamins-Minerals (HAIR SKIN AND NAILS FORMULA) TABS Take 1 tablet by mouth daily.    . rosuvastatin (CRESTOR) 10 MG tablet Take 10 mg by mouth at bedtime.     . sitaGLIPtin (JANUVIA) 100 MG tablet Take 1 tablet (100 mg total) by mouth daily. 45 tablet 1  . CRESTOR 20 MG tablet Take 20 mg by mouth daily.   0   No current facility-administered medications on file prior to visit.   Allergies  Allergen Reactions  . Hydrocodone   . Lipitor [Atorvastatin]   . Lotensin [Benazepril Hcl]   . Zithromax [Azithromycin] Itching  . Zocor [Simvastatin]    Family History  Problem Relation Age of Onset  . COPD Father   . Myasthenia gravis Father   . Cancer Father     colon  . Diabetes Father   . Heart disease Father   . Heart disease Mother     CAD  . Alzheimer's disease Mother   . CAD Mother   . Cancer Maternal Grandfather     Brain tumor  . Diabetes Sister   . Deep vein thrombosis Sister   . Obesity Sister   . Thyroid cancer Daughter   . Obesity Sister   . Hypertension Sister   . Obesity Sister   . Thyroid disease Sister   . Hypertension Sister   . Diabetes Sister    PE: BP 118/64 mmHg  Pulse 85  Temp(Src) 97.6 F (36.4 C) (Oral)  Resp 12  Wt 278 lb 9.6 oz (126.372 kg)  SpO2 96% Wt Readings from Last 3 Encounters:  12/02/14 278 lb 9.6 oz (126.372 kg)  09/06/14 280 lb (127.007 kg)  11/07/13 270 lb (122.471 kg)   Constitutional: obese, in NAD Eyes: PERRLA, EOMI, no exophthalmos ENT: moist mucous membranes, no thyromegaly, no cervical lymphadenopathy Cardiovascular: RRR, + 2/6 SEM, no RG, + B lymphedema L>R Respiratory: CTA B Gastrointestinal: abdomen soft, NT, ND, BS+ Musculoskeletal: no deformities, strength intact in all 4 Skin: moist, warm, no  rashes Neurological: no tremor with outstretched hands, DTR normal in all 4  ASSESSMENT: 1. DM2, insulin-dependent, uncontrolled, without complications  2. HL  PLAN:  1. Patient with long-standing, uncontrolled diabetes, on oral antidiabetic regimen + basal insulin, now much improved after adding Januvia. Sugars are at goal. - We may also try Trulicity/Tanzeum or Invokana/Jardiance at next visit if this is not enough. Would like to avoid meds that can exacerbate weight gain. - I advised her to: Patient Instructions  Please continue: - Lantus 40 units at bedtime  - Metformin 1000 mg 2x a day, with meals  - Januvia 100 mg daily in am   Please return in 3 months with your sugar log.   Please schedule a new eye exam.  KEEP UP THE GREAT WORK!  - continuechecking sugars at different times of the day - check 2 times a day, rotating checks - advised for yearly eye exams - had flu shot this season - checked HbA1c >> 8.5% (better!) - Return to clinic in 3 mo with sugar log   2. HL - reviewed her latest Lipid levels - she ran out of Crestor and could not refill this as it was 700$ - given her a new written Rx for generic Rosuvastatin >> advised to restart - given 2 coupons

## 2014-12-13 ENCOUNTER — Other Ambulatory Visit: Payer: Self-pay | Admitting: Internal Medicine

## 2015-01-29 ENCOUNTER — Ambulatory Visit: Payer: BLUE CROSS/BLUE SHIELD | Admitting: Podiatry

## 2015-01-29 ENCOUNTER — Encounter: Payer: Self-pay | Admitting: Podiatry

## 2015-01-29 ENCOUNTER — Ambulatory Visit (INDEPENDENT_AMBULATORY_CARE_PROVIDER_SITE_OTHER): Payer: BLUE CROSS/BLUE SHIELD | Admitting: Podiatry

## 2015-01-29 DIAGNOSIS — S93602D Unspecified sprain of left foot, subsequent encounter: Secondary | ICD-10-CM | POA: Diagnosis not present

## 2015-01-29 NOTE — Progress Notes (Signed)
Subjective:     Patient ID: Sherri Adams, female   DOB: Jun 24, 1950, 64 y.o.   MRN: [id number]  HPI this diabetic patient returns to the office stating that she has a return of her painful left foot. She states that she was diagnosed with a foot sprain last visit in June and treated with injection therapy. She was also given a pair of pure strides and she responded very well to this regimen. She presents the office today stating that she is experiencing the same pain and discomfort in the outside of her left foot. She desires an evaluation and treatment of this condition   Review of Systems     Objective:   Physical Exam  Objective:   Physical Exam Objective: Review of past medical history, medications, social history and allergies were performed.  Vascular: Dorsalis pedis and posterior tibial pulses were palpable B/L, capillary refill was  WNL B/L, temperature gradient was WNL B/L   Skin: No signs of symptoms of infection or ulcers on both feet  Nails: appear healthy with no signs of mycosis or infections  Sensory: Semmes Weinstein monifilament WNL   Orthopedic: Orthopedic evaluation demonstrates all joints distal t ankle have full ROM without crepitus, muscle power WNL B/L. She has noticeable guarding left foot and ankle. .  Severe leg swelling noted. Palpable sinus tarsi pain left ankle            Assessment:    Foot Sprain/ Sinus tarsitis left foot    Plan:     ROV  Injection therapy left foot. RTC prn.    Gardiner Barefoot DPM

## 2015-03-04 ENCOUNTER — Other Ambulatory Visit (INDEPENDENT_AMBULATORY_CARE_PROVIDER_SITE_OTHER): Payer: BLUE CROSS/BLUE SHIELD | Admitting: *Deleted

## 2015-03-04 ENCOUNTER — Ambulatory Visit (INDEPENDENT_AMBULATORY_CARE_PROVIDER_SITE_OTHER): Payer: BLUE CROSS/BLUE SHIELD | Admitting: Internal Medicine

## 2015-03-04 ENCOUNTER — Encounter: Payer: Self-pay | Admitting: Internal Medicine

## 2015-03-04 VITALS — BP 122/64 | HR 76 | Temp 97.9°F | Resp 12 | Wt 277.4 lb

## 2015-03-04 DIAGNOSIS — E1165 Type 2 diabetes mellitus with hyperglycemia: Secondary | ICD-10-CM

## 2015-03-04 DIAGNOSIS — R200 Anesthesia of skin: Secondary | ICD-10-CM

## 2015-03-04 DIAGNOSIS — R202 Paresthesia of skin: Secondary | ICD-10-CM

## 2015-03-04 DIAGNOSIS — Z794 Long term (current) use of insulin: Secondary | ICD-10-CM

## 2015-03-04 LAB — POCT GLYCOSYLATED HEMOGLOBIN (HGB A1C): Hemoglobin A1C: 8.4

## 2015-03-04 NOTE — Progress Notes (Signed)
Patient ID: Sherri Adams, female   DOB: April 16, 1950, 65 y.o.   MRN: [id number]  HPI: Sherri Adams is a 65 y.o.-year-old female,returning for f/u for DM2, dx in 2006, insulin-dependent since 2013 (was off for 1 years after RenY), uncontrolled, without complications. Last visit 3 mo ago.  Last hemoglobin A1c was: Lab Results  Component Value Date   HGBA1C 8.5 12/02/2014   HGBA1C 9.2* 07/17/2014   HGBA1C * 11/13/2009    8.6 (NOTE)                                                                       According to the ADA Clinical Practice Recommendations for 2011, when HbA1c is used as a screening test:   >=6.5%   Diagnostic of Diabetes Mellitus           (if abnormal result  is confirmed)  5.7-6.4%   Increased risk of developing Diabetes Mellitus  References:Diagnosis and Classification of Diabetes Mellitus,Diabetes [id number] 1):S62-S69 and Standards of Medical Care in         Diabetes - 2011,Diabetes Care,2011,34  (Suppl 1):S11-S61.   Pt is on a regimen of: - Metformin 1000 mg 2x a day, with meals >> tolerated well  - Lantus 40 units at bedtime  - Januvia 100 mg daily in am - added 08/2014 She was on Actos.  She was Invokana >> yeast inf's >> stopped  Pt checks her sugars 1x a day and they are: - am: 128, 175-199 >> 86, 93-120 >> 84-142, 150 - 2h after b'fast: n/c - before lunch: n/c - 2h after lunch: n/c - before dinner: n/c - 2h after dinner:140-150 >> 120-130 - bedtime: n/c - nighttime: n/c No lows. Lowest sugar was 128 >> 86 >> 84; ? if she has hypoglycemia awareness.  Highest sugar was 199 >> 176 >> 170.  Glucometer: ReliOn  Pt's meals are: - Breakfast: Adams muffin + cheese; hashbrowns, eggs + meat - Lunch: frozen dinner,sandwich - Dinner: meat + salad, no starch usually - Snacks: nuts, cheese  - no CKD, last BUN/creatinine:  08/01/2014:19/0.76 Lab Results  Component Value Date   BUN 16 11/12/2009   CREATININE 0.58 11/12/2009  Not on ACEIs.  - last  set of lipids: 08/01/2014: 150/151/43/77 On Crestor 20 mg daily. - last eye exam was in 10/2013. No DR. She had cataract sxs. 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 - no numbness and tingling in her feet.  She also has hypothyroidism, HL, depression.  ROS: Constitutional: no weight gain/loss, no fatigue, no subjective hyperthermia/hypothermia Eyes: no blurry vision, no xerophthalmia ENT: no sore throat, no nodules palpated in throat, no dysphagia/odynophagia, no hoarseness Cardiovascular: no CP/SOB/palpitations/+ leg swelling L>R Respiratory: no cough/no SOB/wheezing Gastrointestinal: no N/V/D/C Musculoskeletal: + muscle/no joint aches Skin: no rashes, + hair loss Neurological: no tremors/numbness/tingling/dizziness  I reviewed pt's medications, allergies, PMH, social hx, family hx, and changes were documented in the history of present illness. Otherwise, unchanged from my initial visit note: Past Medical History  Diagnosis Date  . Thyroid disease   . Hyperlipidemia   . Sinus problem   . Colon polyp   . Arthritis   . Asthma     "perfumes, strong odors"  .  Anxiety   . Depression   . 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 Sleep apnea     no cpap  . Family history of anesthesia complication     mother slow to wake  . Diabetes mellitus without complication ([place])   . Sleep apnea   . Cataracts, bilateral   . Skin cancer of face   . Vitamin D deficiency   . 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 History of radiation therapy     tonsils: 84 months of age  . Adenomatous polyps 9/15   Past Surgical History  Procedure Laterality Date  . Nasal sinus surgery      twice  . Thyroidectomy    . Abdominal hysterectomy    . Tubal ligation    . Tonsilectomy, adenoidectomy, bilateral myringotomy and tubes    . Arm plate      Plate in right arm. Bone was shattered after fall down flight of stairs.  . Cataract surgery Bilateral     right eye  . Gastric bypass       4 yrs ago weight around 250# now and steady  . Tonsillectomy  2000    and adenoids  . Colonoscopy with propofol N/A 11/07/2013    Procedure: COLONOSCOPY WITH PROPOFOL;  Surgeon: Garlan Fair, MD;  Location: [facility];  Service: Endoscopy;  Laterality: N/A;   History   Social History  . Marital Status: Divorced   Occupational History  . Sales adm.   Social History Main Topics  . Smoking status: Former Smoker    Quit date: 10/02/2003  . Smokeless tobacco: Not on file  . Alcohol Use: 0.0 oz/week    0 Standard drinks or equivalent per week     Comment: very rare  . Drug Use: No   Social History Narrative   Divorced   4 adult children   9 grandchildren   Occupation: Equities trader [employer]      Current Outpatient Prescriptions on File Prior to Visit  Medication Sig Dispense Refill  . albuterol (PROVENTIL HFA;VENTOLIN HFA) 108 (90 BASE) MCG/ACT inhaler Inhale 2 puffs into the lungs every 6 (six) hours as needed for wheezing or shortness of breath.    Marland Kitchen aspirin EC 81 MG tablet Take 81 mg by mouth every morning.    Marland Kitchen buPROPion (WELLBUTRIN XL) 300 MG 24 hr tablet Take 300 mg by mouth daily.    . Cholecalciferol (VITAMIN D3) 5000 UNITS CAPS Take 1 capsule by mouth daily.    . cyanocobalamin 500 MCG tablet Take 500 mcg by mouth daily.    . fluticasone (FLONASE) 50 MCG/ACT nasal spray   0  . furosemide (LASIX) 40 MG tablet Take 40 mg by mouth daily.    Marland Kitchen glucose blood (FREESTYLE LITE) test strip 1 each by Other route. Use as instructed    . Insulin Pen Needle (CAREFINE PEN NEEDLES) 32G X 4 MM MISC 1 each by Does not apply route daily.    Marland Kitchen JANUVIA 100 MG tablet TAKE ONE TABLET BY MOUTH ONCE DAILY 45 tablet 2  . LANTUS SOLOSTAR 100 UNIT/ML Solostar Pen Inject 40 Units into the skin.   1  . levothyroxine (SYNTHROID, LEVOTHROID) 112 MCG tablet Take 112 mcg by mouth daily before breakfast.    . meloxicam (MOBIC) 15 MG tablet Take 15 mg by mouth daily.   0  . metFORMIN  (GLUCOPHAGE) 1000 MG tablet Take 1 tablet (1,000 mg total)  by mouth 2 (two) times daily with a meal. 180 tablet 1  . Multiple Vitamin (MULTIVITAMIN PO) Take 1 tablet by mouth every morning.     . Multiple Vitamins-Minerals (HAIR SKIN AND NAILS FORMULA) TABS Take 1 tablet by mouth daily.    . rosuvastatin (CRESTOR) 10 MG tablet Take 10 mg by mouth at bedtime.      No current facility-administered medications on file prior to visit.   Allergies  Allergen Reactions  . Hydrocodone   . Lipitor [Atorvastatin]   . Lotensin [Benazepril Hcl]   . Zithromax [Azithromycin] Itching  . Zocor [Simvastatin]    Family History  Problem Relation Age of Onset  . COPD Father   . Myasthenia gravis Father   . Cancer Father     colon  . Diabetes Father   . Heart disease Father   . Heart disease Mother     CAD  . Alzheimer's disease Mother   . CAD Mother   . Cancer Maternal Grandfather     Brain tumor  . Diabetes Sister   . Deep vein thrombosis Sister   . Obesity Sister   . Thyroid cancer Daughter   . Obesity Sister   . Hypertension Sister   . Obesity Sister   . Thyroid disease Sister   . Hypertension Sister   . Diabetes Sister    PE: BP 122/64 mmHg  Pulse 76  Temp(Src) 97.9 F (36.6 C) (Oral)  Resp 12  Wt 277 lb 6.4 oz (125.828 kg)  SpO2 95% Wt Readings from Last 3 Encounters:  03/04/15 277 lb 6.4 oz (125.828 kg)  12/02/14 278 lb 9.6 oz (126.372 kg)  09/06/14 280 lb (127.007 kg)   Constitutional: obese, in NAD Eyes: PERRLA, EOMI, no exophthalmos ENT: moist mucous membranes, no thyromegaly, no cervical lymphadenopathy Cardiovascular: RRR, + 2/6 SEM, no RG, + B lymphedema L>R Respiratory: CTA B Gastrointestinal: abdomen soft, NT, ND, BS+ Musculoskeletal: no deformities, strength intact in all 4 Skin: moist, warm, no rashes Neurological: no tremor with outstretched hands, DTR normal in all 4  ASSESSMENT: 1. DM2, insulin-dependent, uncontrolled, without complications  2.  Numbness in her left arm and leg  PLAN:  1. Patient with long-standing, uncontrolled diabetes, on oral antidiabetic regimen + basal insulin, much improved after adding Januvia. Sugars are close to goal, but slightly higher than before.  - We may also try Trulicity/Tanzeum or Invokana/Jardiance at next visit if this is not enough. Would like to avoid meds that can exacerbate weight gain. - I advised her to: Patient Instructions  Please continue: - Lantus 40 units at bedtime  - Metformin 1000 mg 2x a day, with meals  - Januvia 100 mg daily in am   Please return in 3 months with your sugar log.   Please schedule a new eye exam.  - continuechecking sugars at different times of the day - check 2 times a day, rotating checks - advised for yearly eye exams - had flu shot this season - checked HbA1c >> 8.4% (same), however this is not concordant with the sugars in her log. We'll check a fructosamine. - Return to clinic in 3 mo with sugar log   2. Numbness in her left arm and leg - will check a B12 vitamin - I also advised her to let PCP know about his symptoms, since they are not typical for diabetic neuropathy.  Orders Only on 03/04/2015  Component Date Value Ref Range Status  . Hemoglobin A1C 03/04/2015 8.4  Final  Office Visit on 03/04/2015  Component Date Value Ref Range Status  . Vitamin B-12 03/04/2015 526  211 - 911 pg/mL Final  . Fructosamine 03/04/2015 246  190 - 270 umol/L Final    Vitamin B12 is normal and her  Hemoglobin A1c calculated from fructosamine is excellent, at 5.8%!!

## 2015-03-04 NOTE — Patient Instructions (Signed)
Please continue: - Lantus 40 units at bedtime  - Metformin 1000 mg 2x a day, with meals  - Januvia 100 mg daily in am   Please return in 3 months with your sugar log.   Please schedule a new eye exam.

## 2015-03-05 LAB — VITAMIN B12: Vitamin B-12: 526 pg/mL (ref 211–911)

## 2015-03-07 ENCOUNTER — Other Ambulatory Visit: Payer: Self-pay | Admitting: Internal Medicine

## 2015-03-10 LAB — FRUCTOSAMINE: Fructosamine: 246 umol/L (ref 190–270)

## 2015-03-19 ENCOUNTER — Encounter: Payer: Self-pay | Admitting: Podiatry

## 2015-03-19 ENCOUNTER — Ambulatory Visit (INDEPENDENT_AMBULATORY_CARE_PROVIDER_SITE_OTHER): Payer: BLUE CROSS/BLUE SHIELD | Admitting: Podiatry

## 2015-03-19 VITALS — BP 108/49 | HR 75 | Resp 14

## 2015-03-19 DIAGNOSIS — S93602D Unspecified sprain of left foot, subsequent encounter: Secondary | ICD-10-CM | POA: Diagnosis not present

## 2015-03-19 DIAGNOSIS — M7672 Peroneal tendinitis, left leg: Secondary | ICD-10-CM

## 2015-03-19 NOTE — Progress Notes (Signed)
Subjective:     Patient ID: Sherri Adams, female   DOB: 14-Aug-1950, 65 y.o.   MRN: [id number]  HPIThis patient returns to the office saying she is still experiencing pain radiating along outside of her left foot.  She was seen last visit and had sinus tarsi with guarding noted left foot.  Today she admits to returning to purestride insoles in her shoe.  She admits swelling noted on outside of her left foot on top .  She presents for evaluation and treatment.   Review of Systems     Objective:   Physical Exam GENERAL APPEARANCE: Alert, conversant. Appropriately groomed. No acute distress.  VASCULAR: Pedal pulses palpable at  [facility] and PT bilateral.  Capillary refill time is immediate to all digits,  Normal temperature gradient.  Digital hair growth is present bilateral  NEUROLOGIC: sensation is normal to 5.07 monofilament at 5/5 sites bilateral.  Light touch is intact bilateral, Muscle strength normal.  MUSCULOSKELETAL: acceptable muscle strength, tone and stability bilateral.  Intrinsic muscluature intact bilateral.  Rectus appearance of foot and digits noted bilateral. Swelling noted over cuboid and at both cuboid articulations.  Pain at articulations of the cuboid left foot.  DERMATOLOGIC: skin color, texture, and turgor are within normal limits.  No preulcerative lesions or ulcers  are seen, no interdigital maceration noted.  No open lesions present.  Digital nails are asymptomatic. No drainage noted.      Assessment:     Foot Sprain  Left foot     Plan:     ROV  Injection therapy.  To RTC 3 weeks for exam.  To consider unna boot if pain persists.   Gardiner Barefoot DPM

## 2015-04-09 ENCOUNTER — Ambulatory Visit (INDEPENDENT_AMBULATORY_CARE_PROVIDER_SITE_OTHER): Payer: BLUE CROSS/BLUE SHIELD | Admitting: Podiatry

## 2015-04-09 ENCOUNTER — Encounter: Payer: Self-pay | Admitting: Podiatry

## 2015-04-09 VITALS — BP 139/68 | HR 72 | Resp 14

## 2015-04-09 DIAGNOSIS — M7672 Peroneal tendinitis, left leg: Secondary | ICD-10-CM

## 2015-04-09 DIAGNOSIS — S93602D Unspecified sprain of left foot, subsequent encounter: Secondary | ICD-10-CM

## 2015-04-09 NOTE — Progress Notes (Signed)
Subjective:     Patient ID: Sherri Adams, female   DOB: [birth year], 65 y.o.   MRN: [id number]  HPI this patient presents to the office follow-up for diagnosis of peroneal tendinitis and sinus tarsitis. she is been treated with injection therapy and prescribed since her last visit 63 weeks ago.  She  states he is now  80% improved. She is also been taking Mobic per another doctor. He is very pleased with her improvement. She presents the office for final evaluation and treatment   Review of Systems     Objective:   Physical Exam Physical Exam GENERAL APPEARANCE: Alert, conversant. Appropriately groomed. No acute distress.  VASCULAR: Pedal pulses palpable at [facility] and PT bilateral. Capillary refill time is immediate to all digits, Normal temperature gradient. Digital hair growth is present bilateral  NEUROLOGIC: sensation is normal to 5.07 monofilament at 5/5 sites bilateral. Light touch is intact bilateral, Muscle strength normal.  MUSCULOSKELETAL: acceptable muscle strength, tone and stability bilateral. Intrinsic muscluature intact bilateral. Rectus appearance of foot and digits noted bilateral. Swelling noted over cuboid and at both cuboid articulations. Pain at articulations of the cuboid left foot  Has subsided.. Sinus tarsi pain is noted.  Pain now is present at lateral malleolar.  DERMATOLOGIC: skin color, texture, and turgor are within normal limits. No preulcerative lesions or ulcers are seen, no interdigital maceration noted. No open lesions present. Digital nails are asymptomatic. No drainage noted     Assessment:     Foot Sprain left     Plan:     ROV  Continue with purestrides and take Mobic.  If condition worsens we will consider a second injection.   Gardiner Barefoot DPM

## 2015-04-18 ENCOUNTER — Telehealth: Payer: Self-pay | Admitting: Internal Medicine

## 2015-04-18 MED ORDER — SITAGLIPTIN PHOSPHATE 100 MG PO TABS: 100.0000 mg | ORAL_TABLET | Freq: Every day | ORAL | Status: AC

## 2015-04-18 NOTE — Telephone Encounter (Signed)
Please call it into [facility] on [address] for the [place] She has a couple days left she will be out before the weekend

## 2015-04-18 NOTE — Telephone Encounter (Signed)
Refill sent.

## 2015-05-16 ENCOUNTER — Other Ambulatory Visit: Payer: Self-pay | Admitting: Orthopedic Surgery

## 2015-05-16 DIAGNOSIS — M75101 Unspecified rotator cuff tear or rupture of right shoulder, not specified as traumatic: Secondary | ICD-10-CM

## 2015-05-21 ENCOUNTER — Ambulatory Visit
Admission: RE | Admit: 2015-05-21 | Discharge: 2015-05-21 | Disposition: A | Discharge status: Home / Self Care | Payer: BLUE CROSS/BLUE SHIELD | Source: Ambulatory Visit | Attending: Orthopedic Surgery | Admitting: Orthopedic Surgery

## 2015-05-21 DIAGNOSIS — M75101 Unspecified rotator cuff tear or rupture of right shoulder, not specified as traumatic: Secondary | ICD-10-CM

## 2015-06-05 ENCOUNTER — Emergency Department (HOSPITAL_COMMUNITY)
Admission: EM | Admit: 2015-06-05 | Discharge: 2015-06-05 | Disposition: A | Discharge status: Home / Self Care | Payer: BLUE CROSS/BLUE SHIELD | Source: Home / Self Care | Attending: Emergency Medicine | Admitting: Emergency Medicine

## 2015-06-05 ENCOUNTER — Encounter (HOSPITAL_COMMUNITY): Payer: Self-pay

## 2015-06-05 DIAGNOSIS — E119 Type 2 diabetes mellitus without complications: Secondary | ICD-10-CM | POA: Insufficient documentation

## 2015-06-05 DIAGNOSIS — Z79899 Other long term (current) drug therapy: Secondary | ICD-10-CM | POA: Insufficient documentation

## 2015-06-05 DIAGNOSIS — R112 Nausea with vomiting, unspecified: Secondary | ICD-10-CM

## 2015-06-05 DIAGNOSIS — E785 Hyperlipidemia, unspecified: Secondary | ICD-10-CM

## 2015-06-05 DIAGNOSIS — F329 Major depressive disorder, single episode, unspecified: Secondary | ICD-10-CM | POA: Insufficient documentation

## 2015-06-05 DIAGNOSIS — R197 Diarrhea, unspecified: Secondary | ICD-10-CM

## 2015-06-05 DIAGNOSIS — Z7984 Long term (current) use of oral hypoglycemic drugs: Secondary | ICD-10-CM | POA: Insufficient documentation

## 2015-06-05 DIAGNOSIS — Z794 Long term (current) use of insulin: Secondary | ICD-10-CM | POA: Insufficient documentation

## 2015-06-05 DIAGNOSIS — Z7951 Long term (current) use of inhaled steroids: Secondary | ICD-10-CM

## 2015-06-05 DIAGNOSIS — M1990 Unspecified osteoarthritis, unspecified site: Secondary | ICD-10-CM

## 2015-06-05 DIAGNOSIS — E872 Acidosis: Secondary | ICD-10-CM | POA: Diagnosis not present

## 2015-06-05 DIAGNOSIS — Z85828 Personal history of other malignant neoplasm of skin: Secondary | ICD-10-CM

## 2015-06-05 DIAGNOSIS — J45909 Unspecified asthma, uncomplicated: Secondary | ICD-10-CM

## 2015-06-05 DIAGNOSIS — E876 Hypokalemia: Secondary | ICD-10-CM | POA: Diagnosis not present

## 2015-06-05 DIAGNOSIS — E079 Disorder of thyroid, unspecified: Secondary | ICD-10-CM

## 2015-06-05 DIAGNOSIS — Z87891 Personal history of nicotine dependence: Secondary | ICD-10-CM | POA: Insufficient documentation

## 2015-06-05 LAB — URINALYSIS, ROUTINE W REFLEX MICROSCOPIC
GLUCOSE, UA: NEGATIVE mg/dL
Hgb urine dipstick: NEGATIVE
Ketones, ur: NEGATIVE mg/dL
LEUKOCYTES UA: NEGATIVE
Nitrite: NEGATIVE
PROTEIN: NEGATIVE mg/dL
Specific Gravity, Urine: 1.011 (ref 1.005–1.030)
pH: 5.5 (ref 5.0–8.0)

## 2015-06-05 LAB — CBC
HCT: 37.1 % (ref 36.0–46.0)
HEMOGLOBIN: 12.6 g/dL (ref 12.0–15.0)
MCH: 27.1 pg (ref 26.0–34.0)
MCHC: 34 g/dL (ref 30.0–36.0)
MCV: 79.8 fL (ref 78.0–100.0)
PLATELETS: 390 10*3/uL (ref 150–400)
RBC: 4.65 MIL/uL (ref 3.87–5.11)
RDW: 15.7 % — ABNORMAL HIGH (ref 11.5–15.5)
WBC: 5.4 10*3/uL (ref 4.0–10.5)

## 2015-06-05 LAB — COMPREHENSIVE METABOLIC PANEL
ALT: 13 U/L — AB (ref 14–54)
ANION GAP: 15 (ref 5–15)
AST: 15 U/L (ref 15–41)
Albumin: 4 g/dL (ref 3.5–5.0)
Alkaline Phosphatase: 143 U/L — ABNORMAL HIGH (ref 38–126)
BUN: 19 mg/dL (ref 6–20)
CHLORIDE: 107 mmol/L (ref 101–111)
CO2: 15 mmol/L — ABNORMAL LOW (ref 22–32)
CREATININE: 1.32 mg/dL — AB (ref 0.44–1.00)
Calcium: 8.4 mg/dL — ABNORMAL LOW (ref 8.9–10.3)
GFR, EST AFRICAN AMERICAN: 49 mL/min — AB (ref >60)
GFR, EST NON AFRICAN AMERICAN: 42 mL/min — AB (ref >60)
Glucose, Bld: 198 mg/dL — ABNORMAL HIGH (ref 65–99)
POTASSIUM: 2.8 mmol/L — AB (ref 3.5–5.1)
Sodium: 137 mmol/L (ref 135–145)
Total Bilirubin: 0.9 mg/dL (ref 0.3–1.2)
Total Protein: 7.7 g/dL (ref 6.5–8.1)

## 2015-06-05 LAB — MAGNESIUM: MAGNESIUM: 0.9 mg/dL — AB (ref 1.7–2.4)

## 2015-06-05 LAB — LIPASE, BLOOD: LIPASE: 20 U/L (ref 11–51)

## 2015-06-05 MED ORDER — ONDANSETRON 4 MG PO TBDP: 4.0000 mg | ORAL_TABLET | Freq: Once | ORAL | Status: AC

## 2015-06-05 MED ORDER — SODIUM CHLORIDE 0.9 % IV BOLUS (SEPSIS)
1000.0000 mL | Freq: Once | INTRAVENOUS | Status: AC
  Administered 2015-06-05: 1000 mL via INTRAVENOUS

## 2015-06-05 MED ORDER — POTASSIUM CHLORIDE CRYS ER 20 MEQ PO TBCR
40.0000 meq | EXTENDED_RELEASE_TABLET | Freq: Once | ORAL | Status: AC
  Administered 2015-06-05: 40 meq via ORAL
  Filled 2015-06-05: qty 2

## 2015-06-05 MED ORDER — MAGNESIUM SULFATE 2 GM/50ML IV SOLN
2.0000 g | Freq: Once | INTRAVENOUS | Status: AC
  Administered 2015-06-05: 2 g via INTRAVENOUS
  Filled 2015-06-05: qty 50

## 2015-06-05 MED ORDER — ONDANSETRON 4 MG PO TBDP
4.0000 mg | ORAL_TABLET | Freq: Once | ORAL | Status: AC
Start: 2015-06-05 — End: 2015-06-05
  Administered 2015-06-05: 4 mg via ORAL
  Filled 2015-06-05: qty 1

## 2015-06-05 MED ORDER — MAGNESIUM SULFATE 2 GM/50ML IV SOLN
2.0000 g | Freq: Once | INTRAVENOUS | Status: AC
Start: 2015-06-05 — End: 2015-06-05
  Administered 2015-06-05: 2 g via INTRAVENOUS
  Filled 2015-06-05: qty 50

## 2015-06-05 NOTE — ED Notes (Signed)
ED PA at bedside

## 2015-06-05 NOTE — ED Notes (Signed)
Pt states she feels better, not as weak or as nauseated

## 2015-06-05 NOTE — Discharge Instructions (Signed)
Call your primary care provider tomorrow to have them check your potassium and magnesium levels tomorrow.   Follow up with your gastroenterologist tomorrow.  Take Zofran for nausea as prescribed.  Return to the Emergency Department if you experience worsening abdominal pain, vomiting or diarrhea, blood in your vomit or stools, headache, dizziness, passing out, shortness of breath or chest pain.  Diarrhea Diarrhea is frequent loose and watery bowel movements. It can cause you to feel weak and dehydrated. Dehydration can cause you to become tired and thirsty, have a dry mouth, and have decreased urination that often is dark yellow. Diarrhea is a sign of another problem, most often an infection that will not last long. In most cases, diarrhea typically lasts 2-3 days. However, it can last longer if it is a sign of something more serious. It is important to treat your diarrhea as directed by your caregiver to lessen or prevent future episodes of diarrhea. CAUSES  Some common causes include:  Gastrointestinal infections caused by viruses, bacteria, or parasites.  Food poisoning or food allergies.  Certain medicines, such as antibiotics, chemotherapy, and laxatives.  Artificial sweeteners and fructose.  Digestive disorders. HOME CARE INSTRUCTIONS  Ensure adequate fluid intake (hydration): Have 1 cup (8 oz) of fluid for each diarrhea episode. Avoid fluids that contain simple sugars or sports drinks, fruit juices, whole milk products, and sodas. Your urine should be clear or pale yellow if you are drinking enough fluids. Hydrate with an oral rehydration solution that you can purchase at pharmacies, retail stores, and online. You can prepare an oral rehydration solution at home by mixing the following ingredients together:   - tsp table salt.   tsp baking soda.   tsp salt substitute containing potassium chloride.  1  tablespoons sugar.  1 L (34 oz) of water.  Certain foods and beverages  may increase the speed at which food moves through the gastrointestinal (GI) tract. These foods and beverages should be avoided and include:  Caffeinated and alcoholic beverages.  High-fiber foods, such as raw fruits and vegetables, nuts, seeds, and whole grain breads and cereals.  Foods and beverages sweetened with sugar alcohols, such as xylitol, sorbitol, and mannitol.  Some foods may be well tolerated and may help thicken stool including:  Starchy foods, such as rice, toast, pasta, low-sugar cereal, oatmeal, grits, baked potatoes, crackers, and bagels.  Bananas.  Applesauce.  Add probiotic-rich foods to help increase healthy bacteria in the GI tract, such as yogurt and fermented milk products.  Wash your hands well after each diarrhea episode.  Only take over-the-counter or prescription medicines as directed by your caregiver.  Take a warm bath to relieve any burning or pain from frequent diarrhea episodes. SEEK IMMEDIATE MEDICAL CARE IF:   You are unable to keep fluids down.  You have persistent vomiting.  You have blood in your stool, or your stools are black and tarry.  You do not urinate in 6-8 hours, or there is only a small amount of very dark urine.  You have abdominal pain that increases or localizes.  You have weakness, dizziness, confusion, or light-headedness.  You have a severe headache.  Your diarrhea gets worse or does not get better.  You have a fever or persistent symptoms for more than 2-3 days.  You have a fever and your symptoms suddenly get worse. MAKE SURE YOU:   Understand these instructions.  Will watch your condition.  Will get help right away if you are not doing well or  get worse.   This information is not intended to replace advice given to you by your health care provider. Make sure you discuss any questions you have with your health care provider.   Document Released: 01/22/2002 Document Revised: 02/22/2014 Document Reviewed:  10/10/2011 Elsevier Interactive Patient Education [employer].

## 2015-06-05 NOTE — ED Notes (Signed)
MD at bedside. 

## 2015-06-05 NOTE — ED Notes (Signed)
Tolerating oral fluids.

## 2015-06-05 NOTE — ED Provider Notes (Signed)
Medical screening examination/treatment/procedure(s) were conducted as a shared visit with non-physician practitioner(s) and myself.  I personally evaluated the patient during the encounter.   EKG Interpretation None      65 year old female who presents with one week of nausea, vomiting, and diarrhea. History of gastric bypass, diabetes, abdominal hysterectomy, and adenomatous polyps. He was seen by her GI doctor yesterday for this and it was recommended that she undergo colonoscopy and endoscopy, which is scheduled in 4 days. She has had some persistent systems with concern for dehydration and sent to the ED for evaluation by her GI doctor. She has had some intermittent abdominal cramping associated with her vomiting or diarrhea. Currently denies any abdominal pain. No fevers, chills, melena, hematochezia. On presentation is hemodynamically stable. Is in no acute distress, but appears dry and dehydrated. She has a soft and benign abdomen. Not suspecting serious intra-abdominal etiology at this time. Blood work with hypomagnesemia and hypokalemia, which is repleted by mouth and IV. Patient also with mild AKI. These derangements likely from dehydration, and given IV fluids and antiemetics. If patient feels improved and able to tolerate PO intake, will follow-up with GI as scheduled in 4 days. If not improved, may need admission for rehydration.  Forde Dandy, MD 06/05/15 [phone]

## 2015-06-05 NOTE — ED Notes (Signed)
Only 1 magnesium IVPB  infusing at present

## 2015-06-05 NOTE — ED Notes (Signed)
Patient was seen by Dr. Wynetta Emery yesterday for vomiting, diarrhea, and left upper abdominal pain. Patient called again today and was told to come to the ED for IV fluids. Patient states she is scheduled for a colonoscopy and an endoscopy in 4 days. Patient denies any vomiting in stool or emesis.

## 2015-06-05 NOTE — ED Provider Notes (Signed)
CSN: [id number]     Arrival date & time 06/05/15  1047 History   First MD Initiated Contact with Patient 06/05/15 1149     Chief Complaint  Patient presents with  . Emesis  . Diarrhea  . Abdominal Pain     (Consider location/radiation/quality/duration/timing/severity/associated sxs/prior Treatment) HPI Comments: Patient is a 65 y/o female with a history of DM, adenomatous polyps, abdominal hysterectomy, tubal ligation, gastric bypass who presents with 8 days of nausea, watery diarrhea, intermittent vomiting, and abdominal cramping. Associated increased belching. She saw GI, Dr. Wynetta Emery yesterday and she has a colonoscopy and an endoscopy scheduled in 4 days. She called Dr. Durenda Age office today and the nurse suggested she come to the ER for fluids. She started omeprazole on 4/7 but stopped taking it once the diarrhea started. She denies sick contacts, recent illness, any new medications, blood in her vomit or stools. Pt states she lost 20lbs in one week and states she doesn't have much of an appetite.  The history is provided by the patient.    Past Medical History  Diagnosis Date  . Thyroid disease   . Hyperlipidemia   . Sinus problem   . Colon polyp   . Arthritis   . Asthma     "perfumes, strong odors"  . Anxiety   . Depression   . 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 Sleep apnea     no cpap  . Family history of anesthesia complication     mother slow to wake  . Diabetes mellitus without complication ([place])   . Sleep apnea   . Cataracts, bilateral   . Skin cancer of face   . Vitamin D deficiency   . 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 History of radiation therapy     tonsils: 50 months of age  . Adenomatous polyps 9/15   Past Surgical History  Procedure Laterality Date  . Nasal sinus surgery      twice  . Thyroidectomy    . Abdominal hysterectomy    . Tubal ligation    . Tonsilectomy, adenoidectomy, bilateral myringotomy and tubes    . Arm plate       Plate in right arm. Bone was shattered after fall down flight of stairs.  . Cataract surgery Bilateral     right eye  . Gastric bypass      4 yrs ago weight around 250# now and steady  . Tonsillectomy  2000    and adenoids  . Colonoscopy with propofol N/A 11/07/2013    Procedure: COLONOSCOPY WITH PROPOFOL;  Surgeon: Garlan Fair, MD;  Location: [facility];  Service: Endoscopy;  Laterality: N/A;  . Mohs surgery     Family History  Problem Relation Age of Onset  . COPD Father   . Myasthenia gravis Father   . Cancer Father     colon  . Diabetes Father   . Heart disease Father   . Heart disease Mother     CAD  . Alzheimer's disease Mother   . CAD Mother   . Cancer Maternal Grandfather     Brain tumor  . Diabetes Sister   . Deep vein thrombosis Sister   . Obesity Sister   . Thyroid cancer Daughter   . Obesity Sister   . Hypertension Sister   . Obesity Sister   . Thyroid disease Sister   . Hypertension Sister   . Diabetes Sister    Social  History  Substance Use Topics  . Smoking status: Former Smoker    Quit date: 10/02/2003  . Smokeless tobacco: Never Used  . Alcohol Use: 0.0 oz/week    0 Standard drinks or equivalent per week     Comment: very rare   OB History    No data available     Review of Systems  Constitutional: Positive for appetite change. Negative for fever and chills.  HENT: Negative for sore throat and trouble swallowing.   Eyes: Negative for visual disturbance.  Respiratory: Negative for cough, chest tightness and shortness of breath.   Cardiovascular: Negative for chest pain and leg swelling.  Gastrointestinal: Positive for nausea, vomiting and diarrhea. Negative for blood in stool and abdominal distention.  Genitourinary: Positive for decreased urine volume. Negative for dysuria, hematuria and difficulty urinating.  Musculoskeletal: Negative for myalgias and arthralgias.  Skin: Negative for rash.  Allergic/Immunologic: Negative for  immunocompromised state.  Neurological: Negative for dizziness, weakness, light-headedness, numbness and headaches.  Psychiatric/Behavioral: Negative for confusion.      Allergies  Zithromax; Hydrocodone; Lipitor; Lotensin; Omeprazole; Other; and Zocor  Home Medications   Prior to Admission medications   Medication Sig Start Date End Date Taking? Authorizing Provider  acetaminophen (TYLENOL) 500 MG tablet Take 1,000 mg by mouth every 6 (six) hours as needed for moderate pain.   Yes Historical Provider, MD  albuterol (PROVENTIL HFA;VENTOLIN HFA) 108 (90 BASE) MCG/ACT inhaler Inhale 2 puffs into the lungs every 6 (six) hours as needed for wheezing or shortness of breath.   Yes Historical Provider, MD  buPROPion (WELLBUTRIN XL) 300 MG 24 hr tablet Take 300 mg by mouth daily.   Yes Historical Provider, MD  Cholecalciferol (VITAMIN D3) 5000 UNITS CAPS Take 1 capsule by mouth daily.   Yes Historical Provider, MD  cyanocobalamin 500 MCG tablet Take 500 mcg by mouth daily.   Yes Historical Provider, MD  fluticasone (FLONASE) 50 MCG/ACT nasal spray Place 2 sprays into both nostrils daily as needed for allergies.  07/17/14  Yes Historical Provider, MD  furosemide (LASIX) 40 MG tablet Take 40 mg by mouth daily as needed for fluid or edema.    Yes Historical Provider, MD  LANTUS SOLOSTAR 100 UNIT/ML Solostar Pen Inject 40 Units into the skin.  06/14/14  Yes Historical Provider, MD  levothyroxine (SYNTHROID, LEVOTHROID) 112 MCG tablet Take 112 mcg by mouth daily before breakfast.   Yes Historical Provider, MD  Multiple Vitamin (MULTIVITAMIN PO) Take 1 tablet by mouth every morning.    Yes Historical Provider, MD  rosuvastatin (CRESTOR) 20 MG tablet Take 20 mg by mouth daily.   Yes Historical Provider, MD  sitaGLIPtin (JANUVIA) 100 MG tablet Take 1 tablet (100 mg total) by mouth daily. 04/18/15  Yes Philemon Kingdom, MD  metFORMIN (GLUCOPHAGE) 1000 MG tablet TAKE ONE TABLET BY MOUTH TWICE DAILY WITH  MEALS Patient not taking: Reported on 06/05/2015 03/07/15   Philemon Kingdom, MD  metFORMIN (GLUCOPHAGE) 500 MG tablet Take 1,000 mg by mouth 2 (two) times daily with a meal.    Historical Provider, MD   BP 144/71 mmHg  Pulse 92  Temp(Src) 97.7 F (36.5 C) (Oral)  Resp 18  Ht 5\' 2"  (1.575 m)  Wt 116.574 kg  BMI 46.99 kg/m2  SpO2 97% Physical Exam  Constitutional: She is oriented to person, place, and time. She appears well-developed and well-nourished. No distress.  HENT:  Head: Normocephalic and atraumatic.  Eyes: Conjunctivae are normal.  Neck: Normal range of motion.  Cardiovascular: Normal rate, regular rhythm and normal heart sounds.   Pulmonary/Chest: Effort normal and breath sounds normal.  Abdominal: Soft. She exhibits no distension and no ascites. Bowel sounds are increased.  Mild TTP of the epigastric region and the RLQ  Musculoskeletal: Normal range of motion.  Mild pitting edema of left leg (pt states it is her baseline)  Neurological: She is alert and oriented to person, place, and time. Coordination normal.  Skin: Skin is warm and dry. No rash noted.  Psychiatric: She has a normal mood and affect. Her behavior is normal.    ED Course  Procedures (including critical care time) Labs Review Labs Reviewed  COMPREHENSIVE METABOLIC PANEL - Abnormal; Notable for the following:    Potassium 2.8 (*)    CO2 15 (*)    Glucose, Bld 198 (*)    Creatinine, Ser 1.32 (*)    Calcium 8.4 (*)    ALT 13 (*)    Alkaline Phosphatase 143 (*)    GFR calc non Af Amer 42 (*)    GFR calc Af Amer 49 (*)    All other components within normal limits  CBC - Abnormal; Notable for the following:    RDW 15.7 (*)    All other components within normal limits  URINALYSIS, ROUTINE W REFLEX MICROSCOPIC (NOT AT [facility]) - Abnormal; Notable for the following:    Bilirubin Urine SMALL (*)    All other components within normal limits  MAGNESIUM - Abnormal; Notable for the following:    Magnesium  0.9 (*)    All other components within normal limits  LIPASE, BLOOD    Imaging Review No results found. I have personally reviewed and evaluated these images and lab results as part of my medical decision-making.   EKG Interpretation None      MDM   Final diagnoses:  Diarrhea, unspecified type  Non-intractable vomiting with nausea, vomiting of unspecified type    Afebril, well appearing patient with 8 days of diarrhea. Mild abdominal cramping with associated nausea and diarrhea, soft and benign abdomen on exam not suspicious for small bowel obstruction or other serious etiology at this time. Low potassium and magnesium replenished PO and IV. Mild AKI seen likely due to dehydration.  Gave fluids and zofran and patient states she is feeling much better. I told patient to follow up with her primary care provider to have potassium and Mag rechecked and to follow up with her GI tomorrow. I went over strict return precautions and the pt expressed full understanding to the return precautions and discharge instructions.   I signed out patient care to Armstead Peaks, PA-C.     Kalman Drape, [place] 06/05/15 1701  Forde Dandy, MD 05/30/[id number]

## 2015-06-05 NOTE — ED Notes (Signed)
Patient is resting comfortably. 

## 2015-06-05 NOTE — ED Provider Notes (Signed)
Assume care from Jackson Latino at 3:53pm  8 days diarrhea with intermittent nausea and vomiting. Replete magnesium with plan for discharge with follow up with GI for colonoscopy scheduled in 4 days and PCP.  6:30p on reassessment with 1.5 g of magnesium repleted, patient reports she is feeling better than when she came. Will finish magnesium drip with plan for discharge and recheck of magnesium and potassium tomorrow at PCP's office.  7:20pm Patient feeling improved and wanting to go home. Patient tolerating >6oz of fluid. Discharged home with Zofran with follow up with PCP tomorrow for recheck of mag and potassium. Follow up with gastroenterology on Monday for colonoscopy and endoscopy. Patient vitals stable and in satisfactory condition on discharge.  Bea Graff. Levis Nazir, PA-C   Frederica Kuster, PA-C 06/05/15 [place], MD 06/07/15 [phone]

## 2015-06-06 ENCOUNTER — Inpatient Hospital Stay (HOSPITAL_COMMUNITY)
Admission: EM | Admit: 2015-06-06 | Discharge: 2015-06-16 | DRG: 629 | Disposition: E | Payer: BLUE CROSS/BLUE SHIELD | Attending: Vascular Surgery | Admitting: Vascular Surgery

## 2015-06-06 ENCOUNTER — Inpatient Hospital Stay (HOSPITAL_COMMUNITY): Payer: BLUE CROSS/BLUE SHIELD

## 2015-06-06 ENCOUNTER — Encounter (HOSPITAL_COMMUNITY): Payer: Self-pay | Admitting: Emergency Medicine

## 2015-06-06 DIAGNOSIS — G4730 Sleep apnea, unspecified: Secondary | ICD-10-CM | POA: Diagnosis present

## 2015-06-06 DIAGNOSIS — Z9884 Bariatric surgery status: Secondary | ICD-10-CM | POA: Diagnosis not present

## 2015-06-06 DIAGNOSIS — Z888 Allergy status to other drugs, medicaments and biological substances status: Secondary | ICD-10-CM

## 2015-06-06 DIAGNOSIS — E1165 Type 2 diabetes mellitus with hyperglycemia: Secondary | ICD-10-CM | POA: Diagnosis present

## 2015-06-06 DIAGNOSIS — E872 Acidosis, unspecified: Secondary | ICD-10-CM | POA: Diagnosis present

## 2015-06-06 DIAGNOSIS — I998 Other disorder of circulatory system: Secondary | ICD-10-CM | POA: Diagnosis not present

## 2015-06-06 DIAGNOSIS — Z6841 Body Mass Index (BMI) 40.0 and over, adult: Secondary | ICD-10-CM | POA: Diagnosis not present

## 2015-06-06 DIAGNOSIS — Z85828 Personal history of other malignant neoplasm of skin: Secondary | ICD-10-CM

## 2015-06-06 DIAGNOSIS — Z8249 Family history of ischemic heart disease and other diseases of the circulatory system: Secondary | ICD-10-CM | POA: Diagnosis not present

## 2015-06-06 DIAGNOSIS — E890 Postprocedural hypothyroidism: Secondary | ICD-10-CM | POA: Diagnosis present

## 2015-06-06 DIAGNOSIS — E876 Hypokalemia: Secondary | ICD-10-CM | POA: Diagnosis present

## 2015-06-06 DIAGNOSIS — R197 Diarrhea, unspecified: Secondary | ICD-10-CM | POA: Diagnosis present

## 2015-06-06 DIAGNOSIS — R29898 Other symptoms and signs involving the musculoskeletal system: Secondary | ICD-10-CM

## 2015-06-06 DIAGNOSIS — K909 Intestinal malabsorption, unspecified: Secondary | ICD-10-CM | POA: Diagnosis present

## 2015-06-06 DIAGNOSIS — I745 Embolism and thrombosis of iliac artery: Secondary | ICD-10-CM | POA: Diagnosis not present

## 2015-06-06 DIAGNOSIS — F419 Anxiety disorder, unspecified: Secondary | ICD-10-CM | POA: Diagnosis present

## 2015-06-06 DIAGNOSIS — Z808 Family history of malignant neoplasm of other organs or systems: Secondary | ICD-10-CM

## 2015-06-06 DIAGNOSIS — Z885 Allergy status to narcotic agent status: Secondary | ICD-10-CM

## 2015-06-06 DIAGNOSIS — Z86010 Personal history of colonic polyps: Secondary | ICD-10-CM

## 2015-06-06 DIAGNOSIS — Z923 Personal history of irradiation: Secondary | ICD-10-CM

## 2015-06-06 DIAGNOSIS — G8220 Paraplegia, unspecified: Secondary | ICD-10-CM | POA: Diagnosis not present

## 2015-06-06 DIAGNOSIS — I4901 Ventricular fibrillation: Secondary | ICD-10-CM | POA: Diagnosis not present

## 2015-06-06 DIAGNOSIS — E860 Dehydration: Secondary | ICD-10-CM | POA: Diagnosis present

## 2015-06-06 DIAGNOSIS — E875 Hyperkalemia: Secondary | ICD-10-CM | POA: Diagnosis not present

## 2015-06-06 DIAGNOSIS — R112 Nausea with vomiting, unspecified: Secondary | ICD-10-CM | POA: Diagnosis not present

## 2015-06-06 DIAGNOSIS — Z87891 Personal history of nicotine dependence: Secondary | ICD-10-CM | POA: Diagnosis not present

## 2015-06-06 DIAGNOSIS — Z881 Allergy status to other antibiotic agents status: Secondary | ICD-10-CM

## 2015-06-06 DIAGNOSIS — Z794 Long term (current) use of insulin: Secondary | ICD-10-CM | POA: Diagnosis not present

## 2015-06-06 DIAGNOSIS — E039 Hypothyroidism, unspecified: Secondary | ICD-10-CM | POA: Diagnosis present

## 2015-06-06 DIAGNOSIS — N289 Disorder of kidney and ureter, unspecified: Secondary | ICD-10-CM

## 2015-06-06 DIAGNOSIS — D509 Iron deficiency anemia, unspecified: Secondary | ICD-10-CM | POA: Diagnosis present

## 2015-06-06 DIAGNOSIS — Z833 Family history of diabetes mellitus: Secondary | ICD-10-CM

## 2015-06-06 DIAGNOSIS — Z883 Allergy status to other anti-infective agents status: Secondary | ICD-10-CM

## 2015-06-06 DIAGNOSIS — I743 Embolism and thrombosis of arteries of the lower extremities: Secondary | ICD-10-CM | POA: Diagnosis not present

## 2015-06-06 DIAGNOSIS — N179 Acute kidney failure, unspecified: Secondary | ICD-10-CM | POA: Diagnosis present

## 2015-06-06 DIAGNOSIS — Z825 Family history of asthma and other chronic lower respiratory diseases: Secondary | ICD-10-CM | POA: Diagnosis not present

## 2015-06-06 DIAGNOSIS — Z886 Allergy status to analgesic agent status: Secondary | ICD-10-CM

## 2015-06-06 DIAGNOSIS — E878 Other disorders of electrolyte and fluid balance, not elsewhere classified: Secondary | ICD-10-CM

## 2015-06-06 DIAGNOSIS — E785 Hyperlipidemia, unspecified: Secondary | ICD-10-CM | POA: Diagnosis present

## 2015-06-06 DIAGNOSIS — J45909 Unspecified asthma, uncomplicated: Secondary | ICD-10-CM | POA: Diagnosis present

## 2015-06-06 DIAGNOSIS — I469 Cardiac arrest, cause unspecified: Secondary | ICD-10-CM | POA: Diagnosis not present

## 2015-06-06 DIAGNOSIS — I70229 Atherosclerosis of native arteries of extremities with rest pain, unspecified extremity: Secondary | ICD-10-CM | POA: Diagnosis not present

## 2015-06-06 DIAGNOSIS — F329 Major depressive disorder, single episode, unspecified: Secondary | ICD-10-CM | POA: Diagnosis present

## 2015-06-06 LAB — COMPREHENSIVE METABOLIC PANEL
ALBUMIN: 3.9 g/dL (ref 3.5–5.0)
ALT: 12 U/L — ABNORMAL LOW (ref 14–54)
AST: 13 U/L — AB (ref 15–41)
Alkaline Phosphatase: 152 U/L — ABNORMAL HIGH (ref 38–126)
Anion gap: 17 — ABNORMAL HIGH (ref 5–15)
BILIRUBIN TOTAL: 1 mg/dL (ref 0.3–1.2)
BUN: 21 mg/dL — AB (ref 6–20)
CHLORIDE: 110 mmol/L (ref 101–111)
CO2: 14 mmol/L — ABNORMAL LOW (ref 22–32)
Calcium: 8.5 mg/dL — ABNORMAL LOW (ref 8.9–10.3)
Creatinine, Ser: 1.37 mg/dL — ABNORMAL HIGH (ref 0.44–1.00)
GFR calc Af Amer: 47 mL/min — ABNORMAL LOW (ref >60)
GFR calc non Af Amer: 40 mL/min — ABNORMAL LOW (ref >60)
GLUCOSE: 240 mg/dL — AB (ref 65–99)
POTASSIUM: 3.1 mmol/L — AB (ref 3.5–5.1)
Sodium: 141 mmol/L (ref 135–145)
Total Protein: 8 g/dL (ref 6.5–8.1)

## 2015-06-06 LAB — GASTROINTESTINAL PANEL BY PCR, STOOL (REPLACES STOOL CULTURE)
ASTROVIRUS: NOT DETECTED
Adenovirus F40/41: NOT DETECTED
CAMPYLOBACTER SPECIES: NOT DETECTED
Cryptosporidium: NOT DETECTED
Cyclospora cayetanensis: NOT DETECTED
E. COLI O157: NOT DETECTED
ENTEROAGGREGATIVE E COLI (EAEC): NOT DETECTED
ENTEROTOXIGENIC E COLI (ETEC): NOT DETECTED
Entamoeba histolytica: NOT DETECTED
Enteropathogenic E coli (EPEC): NOT DETECTED
GIARDIA LAMBLIA: NOT DETECTED
NOROVIRUS GI/GII: NOT DETECTED
PLESIMONAS SHIGELLOIDES: NOT DETECTED
Rotavirus A: NOT DETECTED
SALMONELLA SPECIES: NOT DETECTED
SHIGELLA/ENTEROINVASIVE E COLI (EIEC): NOT DETECTED
Sapovirus (I, II, IV, and V): NOT DETECTED
Shiga like toxin producing E coli (STEC): NOT DETECTED
Vibrio cholerae: NOT DETECTED
Vibrio species: NOT DETECTED
Yersinia enterocolitica: NOT DETECTED

## 2015-06-06 LAB — BASIC METABOLIC PANEL
Anion gap: 12 (ref 5–15)
BUN: 17 mg/dL (ref 6–20)
CHLORIDE: 112 mmol/L — AB (ref 101–111)
CO2: 16 mmol/L — AB (ref 22–32)
CREATININE: 1.06 mg/dL — AB (ref 0.44–1.00)
Calcium: 7.8 mg/dL — ABNORMAL LOW (ref 8.9–10.3)
GFR calc Af Amer: >60 mL/min (ref >60)
GFR calc non Af Amer: 55 mL/min — ABNORMAL LOW (ref >60)
Glucose, Bld: 178 mg/dL — ABNORMAL HIGH (ref 65–99)
POTASSIUM: 3.3 mmol/L — AB (ref 3.5–5.1)
Sodium: 140 mmol/L (ref 135–145)

## 2015-06-06 LAB — CBC WITH DIFFERENTIAL/PLATELET
BASOS PCT: 0 %
Basophils Absolute: 0 10*3/uL (ref 0.0–0.1)
Eosinophils Absolute: 0.1 10*3/uL (ref 0.0–0.7)
Eosinophils Relative: 2 %
HEMATOCRIT: 37.9 % (ref 36.0–46.0)
HEMOGLOBIN: 12.6 g/dL (ref 12.0–15.0)
LYMPHS ABS: 0.7 10*3/uL (ref 0.7–4.0)
Lymphocytes Relative: 14 %
MCH: 27 pg (ref 26.0–34.0)
MCHC: 33.2 g/dL (ref 30.0–36.0)
MCV: 81.3 fL (ref 78.0–100.0)
MONOS PCT: 16 %
Monocytes Absolute: 0.8 10*3/uL (ref 0.1–1.0)
NEUTROS ABS: 3.7 10*3/uL (ref 1.7–7.7)
Neutrophils Relative %: 68 %
Platelets: 373 10*3/uL (ref 150–400)
RBC: 4.66 MIL/uL (ref 3.87–5.11)
RDW: 16 % — ABNORMAL HIGH (ref 11.5–15.5)
WBC: 5.3 10*3/uL (ref 4.0–10.5)

## 2015-06-06 LAB — I-STAT CG4 LACTIC ACID, ED: Lactic Acid, Venous: 1.12 mmol/L (ref 0.5–2.0)

## 2015-06-06 LAB — C DIFFICILE QUICK SCREEN W PCR REFLEX
C DIFFICILE (CDIFF) INTERP: NEGATIVE
C Diff antigen: NEGATIVE
C Diff toxin: NEGATIVE

## 2015-06-06 LAB — GLUCOSE, CAPILLARY
GLUCOSE-CAPILLARY: 161 mg/dL — AB (ref 65–99)
GLUCOSE-CAPILLARY: 162 mg/dL — AB (ref 65–99)
GLUCOSE-CAPILLARY: 175 mg/dL — AB (ref 65–99)

## 2015-06-06 LAB — CBG MONITORING, ED: GLUCOSE-CAPILLARY: 207 mg/dL — AB (ref 65–99)

## 2015-06-06 LAB — MAGNESIUM: Magnesium: 1.9 mg/dL (ref 1.7–2.4)

## 2015-06-06 MED ORDER — POTASSIUM CHLORIDE CRYS ER 20 MEQ PO TBCR
40.0000 meq | EXTENDED_RELEASE_TABLET | Freq: Once | ORAL | Status: DC
Start: 2015-06-06 — End: 2015-06-06

## 2015-06-06 MED ORDER — POTASSIUM CHLORIDE 10 MEQ/100ML IV SOLN
10.0000 meq | INTRAVENOUS | Status: AC
  Administered 2015-06-06 (×2): 10 meq via INTRAVENOUS
  Filled 2015-06-06 (×3): qty 100

## 2015-06-06 MED ORDER — ALBUTEROL SULFATE (2.5 MG/3ML) 0.083% IN NEBU: 2.5000 mg | INHALATION_SOLUTION | Freq: Four times a day (QID) | RESPIRATORY_TRACT | Status: DC | PRN

## 2015-06-06 MED ORDER — POTASSIUM CHLORIDE 10 MEQ/100ML IV SOLN: 10.0000 meq | Freq: Once | INTRAVENOUS | Status: DC

## 2015-06-06 MED ORDER — SODIUM CHLORIDE 0.9% FLUSH
3.0000 mL | Freq: Two times a day (BID) | INTRAVENOUS | Status: DC
  Administered 2015-06-06 – 2015-06-08 (×4): 3 mL via INTRAVENOUS

## 2015-06-06 MED ORDER — RISAQUAD PO CAPS
1.0000 | ORAL_CAPSULE | Freq: Two times a day (BID) | ORAL | Status: DC
  Administered 2015-06-06 – 2015-06-08 (×5): 1 via ORAL
  Filled 2015-06-06 (×9): qty 1

## 2015-06-06 MED ORDER — ONDANSETRON HCL 4 MG/2ML IJ SOLN
4.0000 mg | Freq: Once | INTRAMUSCULAR | Status: AC
  Administered 2015-06-06: 4 mg via INTRAVENOUS
  Filled 2015-06-06: qty 2

## 2015-06-06 MED ORDER — HEPARIN SODIUM (PORCINE) 5000 UNIT/ML IJ SOLN
5000.0000 [IU] | Freq: Three times a day (TID) | INTRAMUSCULAR | Status: DC
  Administered 2015-06-06 – 2015-06-08 (×8): 5000 [IU] via SUBCUTANEOUS
  Filled 2015-06-06 (×8): qty 1

## 2015-06-06 MED ORDER — SACCHAROMYCES BOULARDII 250 MG PO CAPS
250.0000 mg | ORAL_CAPSULE | Freq: Two times a day (BID) | ORAL | Status: DC
  Administered 2015-06-06 – 2015-06-08 (×6): 250 mg via ORAL
  Filled 2015-06-06 (×6): qty 1

## 2015-06-06 MED ORDER — POTASSIUM CHLORIDE CRYS ER 20 MEQ PO TBCR
40.0000 meq | EXTENDED_RELEASE_TABLET | ORAL | Status: AC
  Administered 2015-06-06 (×2): 40 meq via ORAL
  Filled 2015-06-06 (×2): qty 2

## 2015-06-06 MED ORDER — LEVOTHYROXINE SODIUM 112 MCG PO TABS
112.0000 ug | ORAL_TABLET | Freq: Every day | ORAL | Status: DC
  Administered 2015-06-06 – 2015-06-07 (×2): 112 ug via ORAL
  Filled 2015-06-06 (×2): qty 1

## 2015-06-06 MED ORDER — INSULIN ASPART 100 UNIT/ML ~~LOC~~ SOLN
0.0000 [IU] | Freq: Three times a day (TID) | SUBCUTANEOUS | Status: DC
  Administered 2015-06-06 (×3): 2 [IU] via SUBCUTANEOUS
  Administered 2015-06-07 (×2): 3 [IU] via SUBCUTANEOUS
  Administered 2015-06-07: 5 [IU] via SUBCUTANEOUS
  Administered 2015-06-08: 2 [IU] via SUBCUTANEOUS
  Administered 2015-06-08 (×2): 1 [IU] via SUBCUTANEOUS
  Administered 2015-06-08: 2 [IU] via SUBCUTANEOUS

## 2015-06-06 MED ORDER — ROSUVASTATIN CALCIUM 20 MG PO TABS
20.0000 mg | ORAL_TABLET | Freq: Every day | ORAL | Status: DC
  Administered 2015-06-06 – 2015-06-08 (×3): 20 mg via ORAL
  Filled 2015-06-06 (×3): qty 1

## 2015-06-06 MED ORDER — SODIUM BICARBONATE 8.4 % IV SOLN
INTRAVENOUS | Status: DC
  Administered 2015-06-06 – 2015-06-07 (×3): via INTRAVENOUS
  Filled 2015-06-06 (×6): qty 1000

## 2015-06-06 MED ORDER — ONDANSETRON HCL 4 MG PO TABS: 4.0000 mg | ORAL_TABLET | Freq: Four times a day (QID) | ORAL | Status: DC | PRN

## 2015-06-06 MED ORDER — ACETAMINOPHEN 500 MG PO TABS
1000.0000 mg | ORAL_TABLET | Freq: Four times a day (QID) | ORAL | Status: DC | PRN
  Administered 2015-06-07 – 2015-06-08 (×3): 1000 mg via ORAL
  Filled 2015-06-06 (×3): qty 2

## 2015-06-06 MED ORDER — ONDANSETRON HCL 4 MG/2ML IJ SOLN
4.0000 mg | Freq: Four times a day (QID) | INTRAMUSCULAR | Status: DC | PRN
  Administered 2015-06-06 – 2015-06-08 (×7): 4 mg via INTRAVENOUS
  Filled 2015-06-06 (×7): qty 2

## 2015-06-06 MED ORDER — ALBUTEROL SULFATE HFA 108 (90 BASE) MCG/ACT IN AERS: 2.0000 | INHALATION_SPRAY | Freq: Four times a day (QID) | RESPIRATORY_TRACT | Status: DC | PRN

## 2015-06-06 MED ORDER — ADULT MULTIVITAMIN LIQUID CH
15.0000 mL | Freq: Every day | ORAL | Status: DC
  Administered 2015-06-06 – 2015-06-08 (×3): 15 mL via ORAL
  Filled 2015-06-06 (×4): qty 15

## 2015-06-06 MED ORDER — LOPERAMIDE HCL 2 MG PO CAPS: 2.0000 mg | ORAL_CAPSULE | ORAL | Status: DC | PRN

## 2015-06-06 MED ORDER — SODIUM CHLORIDE 0.9 % IV SOLN
1000.0000 mL | INTRAVENOUS | Status: DC
  Administered 2015-06-06: 1000 mL via INTRAVENOUS

## 2015-06-06 MED ORDER — LOPERAMIDE HCL 2 MG PO CAPS: 4.0000 mg | ORAL_CAPSULE | Freq: Every day | ORAL | Status: DC

## 2015-06-06 MED ORDER — LOPERAMIDE HCL 2 MG PO CAPS
4.0000 mg | ORAL_CAPSULE | Freq: Four times a day (QID) | ORAL | Status: DC
  Administered 2015-06-06 – 2015-06-08 (×10): 4 mg via ORAL
  Filled 2015-06-06 (×10): qty 2

## 2015-06-06 MED ORDER — MAGNESIUM SULFATE 2 GM/50ML IV SOLN
2.0000 g | Freq: Once | INTRAVENOUS | Status: AC
  Administered 2015-06-06: 2 g via INTRAVENOUS
  Filled 2015-06-06: qty 50

## 2015-06-06 MED ORDER — LOPERAMIDE HCL 2 MG PO CAPS: 4.0000 mg | ORAL_CAPSULE | ORAL | Status: DC | PRN

## 2015-06-06 MED ORDER — INSULIN ASPART 100 UNIT/ML ~~LOC~~ SOLN
0.0000 [IU] | SUBCUTANEOUS | Status: DC
  Administered 2015-06-06: 3 [IU] via SUBCUTANEOUS
  Filled 2015-06-06: qty 1

## 2015-06-06 MED ORDER — SODIUM CHLORIDE 0.9 % IV SOLN
1000.0000 mL | Freq: Once | INTRAVENOUS | Status: AC
  Administered 2015-06-06: 1000 mL via INTRAVENOUS

## 2015-06-06 MED ORDER — MULTIVITAMIN PO LIQD: Freq: Every morning | ORAL | Status: DC

## 2015-06-06 MED ORDER — INSULIN GLARGINE 100 UNIT/ML ~~LOC~~ SOLN
10.0000 [IU] | Freq: Every day | SUBCUTANEOUS | Status: DC
  Administered 2015-06-06: 10 [IU] via SUBCUTANEOUS
  Filled 2015-06-06: qty 0.1

## 2015-06-06 MED ORDER — FLUTICASONE PROPIONATE 50 MCG/ACT NA SUSP
2.0000 | Freq: Every day | NASAL | Status: DC | PRN
  Filled 2015-06-06: qty 16

## 2015-06-06 MED ORDER — BUPROPION HCL ER (XL) 300 MG PO TB24
300.0000 mg | ORAL_TABLET | Freq: Every day | ORAL | Status: DC
  Administered 2015-06-06 – 2015-06-08 (×3): 300 mg via ORAL
  Filled 2015-06-06 (×3): qty 1

## 2015-06-06 NOTE — ED Provider Notes (Signed)
CSN: [id number]     Arrival date & time 06/05/2015  0205 History   By signing my name below, I, Rowan Blase, attest that this documentation has been prepared under the direction and in the presence of Delora Fuel, MD . Electronically Signed: Rowan Blase, Scribe. 05/24/2015. 3:33 AM.    Chief Complaint  Patient presents with  . Diarrhea  . Emesis   The history is provided by the patient. No language interpreter was used.   HPI Comments:  Sherri Adams is a 65 y.o. female with PMHx of HLD, DM and gastric bypass who presents to the Emergency Department complaining of persistent emesis and diarrhea for nine days. Pt reports associated nausea, chills, and mild lower central abdominal cramping. She notes vomiting has currently decreased because she hasn't been able to eat. Pt was seen yesterday for these symptoms; she was treated and sent home with a prescription for Zofran. She took Zofran ~2200 without relief; she has also taken Imodium without relief. Pt is scheduled for endoscopy and colonoscopy next week. Denies fever, antibiotic use in the past few months or h/o c.diff.  Past Medical History  Diagnosis Date  . Thyroid disease   . Hyperlipidemia   . Sinus problem   . Colon polyp   . Arthritis   . Asthma     "perfumes, strong odors"  . Anxiety   . Depression   . 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 Sleep apnea     no cpap  . Family history of anesthesia complication     mother slow to wake  . Diabetes mellitus without complication ([place])   . Sleep apnea   . Cataracts, bilateral   . Skin cancer of face   . Vitamin D deficiency   . 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 History of radiation therapy     tonsils: 69 months of age  . Adenomatous polyps 9/15   Past Surgical History  Procedure Laterality Date  . Nasal sinus surgery      twice  . Thyroidectomy    . Abdominal hysterectomy    . Tubal ligation    . Tonsilectomy, adenoidectomy, bilateral  myringotomy and tubes    . Arm plate      Plate in right arm. Bone was shattered after fall down flight of stairs.  . Cataract surgery Bilateral     right eye  . Gastric bypass      4 yrs ago weight around 250# now and steady  . Tonsillectomy  2000    and adenoids  . Colonoscopy with propofol N/A 11/07/2013    Procedure: COLONOSCOPY WITH PROPOFOL;  Surgeon: Garlan Fair, MD;  Location: [facility];  Service: Endoscopy;  Laterality: N/A;  . Mohs surgery     Family History  Problem Relation Age of Onset  . COPD Father   . Myasthenia gravis Father   . Cancer Father     colon  . Diabetes Father   . Heart disease Father   . Heart disease Mother     CAD  . Alzheimer's disease Mother   . CAD Mother   . Cancer Maternal Grandfather     Brain tumor  . Diabetes Sister   . Deep vein thrombosis Sister   . Obesity Sister   . Thyroid cancer Daughter   . Obesity Sister   . Hypertension Sister   . Obesity Sister   . Thyroid disease Sister   .  Hypertension Sister   . Diabetes Sister    Social History  Substance Use Topics  . Smoking status: Former Smoker    Quit date: 10/02/2003  . Smokeless tobacco: Never Used  . Alcohol Use: 0.0 oz/week    0 Standard drinks or equivalent per week     Comment: very rare   OB History    No data available     Review of Systems  Constitutional: Positive for chills. Negative for fever.  Gastrointestinal: Positive for nausea, vomiting, abdominal pain and diarrhea.  All other systems reviewed and are negative.  Allergies  Zithromax; Hydrocodone; Lipitor; Lotensin; Omeprazole; Other; and Zocor  Home Medications   Prior to Admission medications   Medication Sig Start Date End Date Taking? Authorizing Provider  acetaminophen (TYLENOL) 500 MG tablet Take 1,000 mg by mouth every 6 (six) hours as needed for moderate pain.   Yes Historical Provider, MD  albuterol (PROVENTIL HFA;VENTOLIN HFA) 108 (90 BASE) MCG/ACT inhaler Inhale 2 puffs into the  lungs every 6 (six) hours as needed for wheezing or shortness of breath.   Yes Historical Provider, MD  buPROPion (WELLBUTRIN XL) 300 MG 24 hr tablet Take 300 mg by mouth daily.   Yes Historical Provider, MD  Cholecalciferol (VITAMIN D3) 5000 UNITS CAPS Take 5,000 Units by mouth daily.    Yes Historical Provider, MD  cyanocobalamin 500 MCG tablet Take 500 mcg by mouth daily.   Yes Historical Provider, MD  fluticasone (FLONASE) 50 MCG/ACT nasal spray Place 2 sprays into both nostrils daily as needed for allergies.  07/17/14  Yes Historical Provider, MD  furosemide (LASIX) 40 MG tablet Take 40 mg by mouth daily as needed for fluid or edema.    Yes Historical Provider, MD  LANTUS SOLOSTAR 100 UNIT/ML Solostar Pen Inject 40 Units into the skin.  06/14/14  Yes Historical Provider, MD  levothyroxine (SYNTHROID, LEVOTHROID) 112 MCG tablet Take 112 mcg by mouth daily before breakfast.   Yes Historical Provider, MD  Multiple Vitamin (MULTIVITAMIN PO) Take 1 tablet by mouth every morning.    Yes Historical Provider, MD  ondansetron (ZOFRAN-ODT) 4 MG disintegrating tablet Take 1 tablet (4 mg total) by mouth once. Patient taking differently: Take 4 mg by mouth every 8 (eight) hours as needed for nausea or vomiting.  06/05/15  Yes Bea Graff Law, PA-C  rosuvastatin (CRESTOR) 20 MG tablet Take 20 mg by mouth daily.   Yes Historical Provider, MD  sitaGLIPtin (JANUVIA) 100 MG tablet Take 1 tablet (100 mg total) by mouth daily. 04/18/15  Yes Philemon Kingdom, MD  metFORMIN (GLUCOPHAGE) 1000 MG tablet TAKE ONE TABLET BY MOUTH TWICE DAILY WITH MEALS Patient not taking: Reported on 06/05/2015 03/07/15   Philemon Kingdom, MD   BP 121/74 mmHg  Pulse 117  Temp(Src) 97.6 F (36.4 C) (Oral)  Resp 18  SpO2 98% Physical Exam  Constitutional: She is oriented to person, place, and time. No distress.  Obese  HENT:  Head: Normocephalic and atraumatic.  Right Ear: Hearing normal.  Left Ear: Hearing normal.  Nose: Nose normal.   Mouth/Throat: Oropharynx is clear and moist and mucous membranes are normal.  Eyes: Conjunctivae and EOM are normal. Pupils are equal, round, and reactive to light.  Neck: Normal range of motion. Neck supple. No JVD present.  Cardiovascular: Normal rate, regular rhythm, S1 normal and S2 normal.  Exam reveals no gallop and no friction rub.   No murmur heard. 2/6 systolic ejection murmur heard at left sternal border  Pulmonary/Chest: Effort normal and breath sounds normal. She has no wheezes. She has no rales. She exhibits no tenderness.  Abdominal: Soft. Normal appearance. She exhibits no mass. There is no hepatosplenomegaly. There is no tenderness. There is no rebound, no guarding, no tenderness at McBurney's point and negative Murphy's sign. No hernia.  Hyperactive bowel sounds  Musculoskeletal: Normal range of motion. She exhibits no edema.  Lymphadenopathy:    She has no cervical adenopathy.  Neurological: She is alert and oriented to person, place, and time. She has normal strength. No cranial nerve deficit or sensory deficit. Coordination normal. GCS eye subscore is 4. GCS verbal subscore is 5. GCS motor subscore is 6.  Skin: Skin is warm, dry and intact. No rash noted. No cyanosis.  Psychiatric: She has a normal mood and affect. Her speech is normal and behavior is normal. Judgment and thought content normal.  Nursing note and vitals reviewed.   ED Course  Procedures  DIAGNOSTIC STUDIES:  Oxygen Saturation is 98% on RA, normal by my interpretation.    COORDINATION OF CARE:  3:32 AM Discussed lab results with pt. Will order c difficile scan, CMP, CBC, lactic acid, and magnesium. Will start IV fluids and administer Zofran. Discussed treatment plan with pt at bedside and pt agreed to plan.  Labs Review Results for orders placed or performed during the hospital encounter of 06/01/2015  C difficile quick scan w PCR reflex  Result Value Ref Range   C Diff antigen NEGATIVE NEGATIVE    C Diff toxin NEGATIVE NEGATIVE   C Diff interpretation Negative for toxigenic C. difficile   Comprehensive metabolic panel  Result Value Ref Range   Sodium 141 135 - 145 mmol/L   Potassium 3.1 (L) 3.5 - 5.1 mmol/L   Chloride 110 101 - 111 mmol/L   CO2 14 (L) 22 - 32 mmol/L   Glucose, Bld 240 (H) 65 - 99 mg/dL   BUN 21 (H) 6 - 20 mg/dL   Creatinine, Ser 1.37 (H) 0.44 - 1.00 mg/dL   Calcium 8.5 (L) 8.9 - 10.3 mg/dL   Total Protein 8.0 6.5 - 8.1 g/dL   Albumin 3.9 3.5 - 5.0 g/dL   AST 13 (L) 15 - 41 U/L   ALT 12 (L) 14 - 54 U/L   Alkaline Phosphatase 152 (H) 38 - 126 U/L   Total Bilirubin 1.0 0.3 - 1.2 mg/dL   GFR calc non Af Amer 40 (L) >60 mL/min   GFR calc Af Amer 47 (L) >60 mL/min   Anion gap 17 (H) 5 - 15  CBC with Differential  Result Value Ref Range   WBC 5.3 4.0 - 10.5 K/uL   RBC 4.66 3.87 - 5.11 MIL/uL   Hemoglobin 12.6 12.0 - 15.0 g/dL   HCT 37.9 36.0 - 46.0 %   MCV 81.3 78.0 - 100.0 fL   MCH 27.0 26.0 - 34.0 pg   MCHC 33.2 30.0 - 36.0 g/dL   RDW 16.0 (H) 11.5 - 15.5 %   Platelets 373 150 - 400 K/uL   Neutrophils Relative % 68 %   Lymphocytes Relative 14 %   Monocytes Relative 16 %   Eosinophils Relative 2 %   Basophils Relative 0 %   Neutro Abs 3.7 1.7 - 7.7 K/uL   Lymphs Abs 0.7 0.7 - 4.0 K/uL   Monocytes Absolute 0.8 0.1 - 1.0 K/uL   Eosinophils Absolute 0.1 0.0 - 0.7 K/uL   Basophils Absolute 0.0 0.0 - 0.1 K/uL  RBC Morphology POLYCHROMASIA PRESENT    WBC Morphology MILD LEFT SHIFT (1-5% METAS, OCC MYELO, OCC BANDS)   Magnesium  Result Value Ref Range   Magnesium 1.9 1.7 - 2.4 mg/dL  I-Stat CG4 Lactic Acid, ED  Result Value Ref Range   Lactic Acid, Venous 1.12 0.5 - 2.0 mmol/L   I have personally reviewed and evaluated these lab results as part of my medical decision-making.   MDM   Final diagnoses:  Intractable diarrhea  Non-intractable vomiting with nausea, unspecified vomiting type  Low bicarbonate level  Hypokalemia  Renal insufficiency     Intractable diarrhea with nausea. Nausea not responding to outpatient management. Old records were reviewed in she was in the ED during the afternoon on and was noted to have hypokalemia, hypomagnesemia, metabolic acidosis and elevated creatinine compared with baseline. No risk factors for Clostridium difficile infection, but C. difficile panel was sent which has come back negative. Electrolytes were repeated showing good improvement in hypokalemia but she is still hypokalemic, and magnesium has come up to borderline levels. Renal insufficiency is still present. Prior records showed normal creatinine in 2011, but no electrolytes in the interim. Patient does have blood work that was done in her doctor's office 2 weeks ago, but that was just a CBC and ferritin level. Hemoglobin at that time was 10.6. In light of intractable diarrhea and severe metabolic acidosis, she will need to be admitted. Low CO2 is felt to be due to bicarbonate losses through the diarrhea. Case is discussed with Dr. Alcario Drought [facility] who agrees to admit the patient.  I personally performed the services described in this documentation, which was scribed in my presence. The recorded information has been reviewed and is accurate.       Delora Fuel, MD [id number]

## 2015-06-06 NOTE — Consult Note (Signed)
Referring Provider: Dr. Irine Seal Primary Care Physician:  Vidal Schwalbe, MD Primary Gastroenterologist:  Dr. Earle Gell  Reason for Consultation:  Nausea vomiting diarrhea  HPI: Sherri Adams is a 65 y.o. female status post gastric bypass surgery who was in her usual state of health until 9 days ago. Shortly after coming home from work, she had the abrupt onset of watery diarrhea. Her baseline bowel habit is one bowel movement per day. Since then, she has been having watery bowel movements on and off, probably getting worse over the past several days, typically about 10 per day, completely watery in character, no blood, no severe cramps. Along with this, she has had some nausea and occasional vomiting, as well as occasional shaking chills but no fevers.  During this time, she has been eating very minimally, and has lost about 20 pounds.  There has been intermittent nausea, as well as occasional vomiting (mostly dry heaves), but clearly the predominant symptoms are pertaining to her diarrhea, rather than the upper tract.  There is no history of exposure to antibiotics within the past several months, no foreign travel, no new pets, no exposure to people with similar illnesses. The patient does use well water.  She was recently seen by her primary gastroenterologist, Dr. Howell Rucks, for evaluation of chronic iron deficiency anemia; she had been referred there by her primary physician. Of note, the patient has had several colonoscopies by Dr. Wynetta Emery, the most recent one approximately a year and a half ago in September 2015, at which time 3 diminutive adenomatous polyps were found but there was no obvious source of diarrhea seen. She has never had endoscopic evaluation.  The patient received IV fluids in the emergency room yesterday and was actually sent home, but came back around 2:00 this morning because of more or less continuous diarrhea.  Meanwhile, stool studies for C. difficile  and enteric pathogens, including nor rotavirus and rotavirus, have come back negative. No evidence of Giardia, amoebiasis, or other pathogens.   Past Medical History  Diagnosis Date  . Thyroid disease   . Hyperlipidemia   . Sinus problem   . Colon polyp   . Arthritis   . Asthma     "perfumes, strong odors"  . Anxiety   . Depression   . 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 Sleep apnea     no cpap  . Family history of anesthesia complication     mother slow to wake  . Diabetes mellitus without complication ([place])   . Sleep apnea   . Cataracts, bilateral   . Skin cancer of face   . Vitamin D deficiency   . 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 History of radiation therapy     tonsils: 80 months of age  . Adenomatous polyps 9/15    Past Surgical History  Procedure Laterality Date  . Nasal sinus surgery      twice  . Thyroidectomy    . Abdominal hysterectomy    . Tubal ligation    . Tonsilectomy, adenoidectomy, bilateral myringotomy and tubes    . Arm plate      Plate in right arm. Bone was shattered after fall down flight of stairs.  . Cataract surgery Bilateral     right eye  . Gastric bypass      4 yrs ago weight around 250# now and steady  . Tonsillectomy  2000    and adenoids  .  Colonoscopy with propofol N/A 11/07/2013    Procedure: COLONOSCOPY WITH PROPOFOL;  Surgeon: Garlan Fair, MD;  Location: [facility];  Service: Endoscopy;  Laterality: N/A;  . Mohs surgery      Prior to Admission medications   Medication Sig Start Date End Date Taking? Authorizing Provider  acetaminophen (TYLENOL) 500 MG tablet Take 1,000 mg by mouth every 6 (six) hours as needed for moderate pain.   Yes Historical Provider, MD  albuterol (PROVENTIL HFA;VENTOLIN HFA) 108 (90 BASE) MCG/ACT inhaler Inhale 2 puffs into the lungs every 6 (six) hours as needed for wheezing or shortness of breath.   Yes Historical Provider, MD  buPROPion (WELLBUTRIN XL) 300 MG 24 hr tablet  Take 300 mg by mouth daily.   Yes Historical Provider, MD  Cholecalciferol (VITAMIN D3) 5000 UNITS CAPS Take 5,000 Units by mouth daily.    Yes Historical Provider, MD  cyanocobalamin 500 MCG tablet Take 500 mcg by mouth daily.   Yes Historical Provider, MD  fluticasone (FLONASE) 50 MCG/ACT nasal spray Place 2 sprays into both nostrils daily as needed for allergies.  07/17/14  Yes Historical Provider, MD  furosemide (LASIX) 40 MG tablet Take 40 mg by mouth daily as needed for fluid or edema.    Yes Historical Provider, MD  LANTUS SOLOSTAR 100 UNIT/ML Solostar Pen Inject 40 Units into the skin.  06/14/14  Yes Historical Provider, MD  levothyroxine (SYNTHROID, LEVOTHROID) 112 MCG tablet Take 112 mcg by mouth daily before breakfast.   Yes Historical Provider, MD  Multiple Vitamin (MULTIVITAMIN PO) Take 1 tablet by mouth every morning.    Yes Historical Provider, MD  ondansetron (ZOFRAN-ODT) 4 MG disintegrating tablet Take 1 tablet (4 mg total) by mouth once. Patient taking differently: Take 4 mg by mouth every 8 (eight) hours as needed for nausea or vomiting.  06/05/15  Yes Bea Graff Law, PA-C  rosuvastatin (CRESTOR) 20 MG tablet Take 20 mg by mouth daily.   Yes Historical Provider, MD  sitaGLIPtin (JANUVIA) 100 MG tablet Take 1 tablet (100 mg total) by mouth daily. 04/18/15  Yes Philemon Kingdom, MD  metFORMIN (GLUCOPHAGE) 1000 MG tablet TAKE ONE TABLET BY MOUTH TWICE DAILY WITH MEALS Patient not taking: Reported on 06/05/2015 03/07/15   Philemon Kingdom, MD    Current Facility-Administered Medications  Medication Dose Route Frequency Provider Last Rate Last Dose  . acetaminophen (TYLENOL) tablet 1,000 mg  1,000 mg Oral Q6H PRN Etta Quill, DO      . albuterol (PROVENTIL) (2.5 MG/3ML) 0.083% nebulizer solution 2.5 mg  2.5 mg Nebulization Q6H PRN Etta Quill, DO      . buPROPion (WELLBUTRIN XL) 24 hr tablet 300 mg  300 mg Oral Daily Etta Quill, DO   300 mg at 06/10/2015 0907  . dextrose 5 %  1,000 mL with potassium chloride 40 mEq, sodium bicarbonate 150 mEq infusion   Intravenous Continuous Eugenie Filler, MD 125 mL/hr at 05/17/2015 1018    . fluticasone (FLONASE) 50 MCG/ACT nasal spray 2 spray  2 spray Each Nare Daily PRN Etta Quill, DO      . heparin injection 5,000 Units  5,000 Units Subcutaneous Q8H Etta Quill, DO   5,000 Units at 05/26/2015 [id number]  . insulin aspart (novoLOG) injection 0-9 Units  0-9 Units Subcutaneous TID AC & HS Eugenie Filler, MD   2 Units at 05/28/2015 1250  . insulin glargine (LANTUS) injection 10 Units  10 Units Subcutaneous QHS Etta Quill,  DO      . levothyroxine (SYNTHROID, LEVOTHROID) tablet 112 mcg  112 mcg Oral QAC breakfast Etta Quill, DO   112 mcg at 05/17/2015 [id number]  . loperamide (IMODIUM) capsule 4 mg  4 mg Oral PRN Eugenie Filler, MD      . multivitamin liquid 15 mL  15 mL Oral Daily Etta Quill, DO   15 mL at 05/20/2015 1000  . ondansetron (ZOFRAN) tablet 4 mg  4 mg Oral Q6H PRN Etta Quill, DO       Or  . ondansetron [facility]) injection 4 mg  4 mg Intravenous Q6H PRN Etta Quill, DO      . potassium chloride 10 mEq in 100 mL IVPB  10 mEq Intravenous Once Delora Fuel, MD   10 mEq at 06/02/2015 [id number]  . potassium chloride SA (K-DUR,KLOR-CON) CR tablet 40 mEq  40 mEq Oral Q4H Eugenie Filler, MD   40 mEq at 05/21/2015 0907  . rosuvastatin (CRESTOR) tablet 20 mg  20 mg Oral q1800 Etta Quill, DO      . sodium chloride flush (NS) 0.9 % injection 3 mL  3 mL Intravenous Q12H Etta Quill, DO   3 mL at 06/07/2015 1000    Allergies as of 05/22/2015 - Review Complete 05/23/2015  Allergen Reaction Noted  . Zithromax [azithromycin] Anaphylaxis and Itching 10/02/2010  . Hydrocodone Itching 07/31/2014  . Lipitor [atorvastatin] Other (See Comments) 07/31/2014  . Lotensin [benazepril hcl] Other (See Comments) 07/31/2014  . Omeprazole Diarrhea 06/05/2015  . Other Other (See Comments) 06/05/2015  . Zocor [simvastatin] Other (See  Comments) 07/31/2014    Family History  Problem Relation Age of Onset  . COPD Father   . Myasthenia gravis Father   . Cancer Father     colon  . Diabetes Father   . Heart disease Father   . Heart disease Mother     CAD  . Alzheimer's disease Mother   . CAD Mother   . Cancer Maternal Grandfather     Brain tumor  . Diabetes Sister   . Deep vein thrombosis Sister   . Obesity Sister   . Thyroid cancer Daughter   . Obesity Sister   . Hypertension Sister   . Obesity Sister   . Thyroid disease Sister   . Hypertension Sister   . Diabetes Sister     Social History   Social History  . Marital Status: Divorced    Spouse Name: N/A  . Number of Children: N/A  . Years of Education: N/A   Occupational History  . Not on file.   Social History Main Topics  . Smoking status: Former Smoker    Quit date: 10/02/2003  . Smokeless tobacco: Never Used  . Alcohol Use: 0.0 oz/week    0 Standard drinks or equivalent per week     Comment: very rare  . Drug Use: No  . Sexual Activity: Not Currently   Other Topics Concern  . Not on file   Social History Narrative   Divorced   4 adult children   9 grandchildren   Occupation: [employer]       Review of Systems: Negative for skin rashes, enlarged lymph nodes, cough or shortness of breath, exertional chest pain, focal neurologic symptoms such as paresthesias, no dizziness, no urinary symptoms such as hematuria or dysuria. Patient does note that she gets lower extremity swelling (sits at a computer all day).  Physical Exam:  Vital signs in last 24 hours: Temp:  [97.6 F (36.4 C)-98.3 F (36.8 C)] 98.3 F (36.8 C) (04/21 1423) Pulse Rate:  [87-117] 87 (04/21 1423) Resp:  [14-18] 18 (04/21 1423) BP: (104-139)/(51-101) 104/61 mmHg (04/21 1423) SpO2:  [90 %-100 %] 100 % (04/21 1423) Weight:  [118.1 kg (260 lb 5.8 oz)] 118.1 kg (260 lb 5.8 oz) (04/21 1202) Last BM Date: 06/12/2015 General:   Alert,  Significantly  overweight,  pleasant and cooperative in NAD; sister at bedside. Head:  Normocephalic and atraumatic. Eyes:  Sclera clear, no icterus.   Conjunctiva pink. Mouth:   No ulcerations or lesions.  Oropharynx pink & moist. Neck:   No masses or thyromegaly. Lungs:  Clear throughout to auscultation.   No wheezes, crackles, or rhonchi. No evident respiratory distress. Heart:   Regular rate and rhythm; no murmurs, clicks, rubs,  or gallops. Abdomen:  Soft, nontender, nontympanitic, and nondistended. No masses, hepatosplenomegaly or ventral hernias noted. Normal bowel sounds, without bruits, guarding, or rebound.   Msk:   Symmetrical without gross deformities. Pulses:  Normal radial pulse is noted. Extremities:   Without clubbing, cyanosis, or pitting edema although lower extremities are quite adipose. Neurologic:  Alert and coherent;  grossly normal neurologically. Skin:  Intact without significant lesions or rashes. Cervical Nodes:  No significant cervical adenopathy. Psych:   Alert and cooperative. Normal mood and affect.  Intake/Output from previous day:   Intake/Output this shift: Total I/O In: 500 [I.Adams.:400; IV Piggyback:100] Out: 7 [Urine:3; Stool:4]  Lab Results:  Recent Labs  06/05/15 1151 05/29/2015 0420  WBC 5.4 5.3  HGB 12.6 12.6  HCT 37.1 37.9  PLT 390 373   BMET  Recent Labs  06/05/15 1151 05/21/2015 0420 06/12/2015 1208  NA 137 141 140  K 2.8* 3.1* 3.3*  CL 107 110 112*  CO2 15* 14* 16*  GLUCOSE 198* 240* 178*  BUN 19 21* 17  CREATININE 1.32* 1.37* 1.06*  CALCIUM 8.4* 8.5* 7.8*   LFT  Recent Labs  05/24/2015 0420  PROT 8.0  ALBUMIN 3.9  AST 13*  ALT 12*  ALKPHOS 152*  BILITOT 1.0   PT/INR No results for input(s): LABPROT, INR in the last 72 hours.  Studies/Results: Dg Abd 1 View  06/11/2015  CLINICAL DATA:  Nausea and vomiting for 9 days with weight loss EXAM: ABDOMEN - 1 VIEW COMPARISON:  CT abdomen and pelvis August 06, 2004 FINDINGS: There are loops of  dilated proximal to mid small bowel. No appreciable air-fluid level. No free air. No abnormal calcifications. There is degenerative change in both hip joints. IMPRESSION: Dilatation of proximal to mid small bowel. This finding is concerning for ileus or possibly a degree of partial bowel obstruction. No free air. Electronically Signed   By: Lowella Grip III M.D.   On: 05/22/2015 07:02    Impression:  The abrupt onset of upper and lower GI tract symptoms is strongly suggestive of an acute infectious illness, despite the negative stool studies.  Plan: 1. No indication for antibiotics 2. Initiate probiotics to help restore intestinal microflora 3. Change Imodium from prn to scheduled dosing, in high doses 4. Stool for occult blood 5. Patient is tentatively scheduled for upper endoscopy and colonoscopy on Monday, if she does not improve in the meantime. During that time, random biopsies of the small bowel and large intestine will probably be obtained, to look for an intestinal cause of her diarrhea and nausea. These tests would also help to evaluate her chronic  iron deficiency anemia.   LOS: 0 days   Sherri Adams  06/15/2015, 2:46 PM   Pager [phone] If no answer or after 5 PM call [phone]

## 2015-06-06 NOTE — ED Notes (Signed)
Pt transported to Xray. 

## 2015-06-06 NOTE — ED Notes (Signed)
MD at bedside. 

## 2015-06-06 NOTE — Progress Notes (Signed)
I have seen and assessed patient and agree with  Dr. Juleen China assessment and plan. Stool studies for C. Difficile PCR negative. GI pathogen panel is negative. Replete electrolytes, supportive care. Patient has been seen in consultation by GI who is following.

## 2015-06-06 NOTE — ED Notes (Signed)
Pt can go au at 07:40

## 2015-06-06 NOTE — ED Notes (Signed)
Pt states she was seen earlier today for same  Pt states when she was here earlier she was told she was dehydrated and her electrolytes were low  Pt states she was given IVF, potassium and magnesium  Pt states she was sent home with a script for zofran and she took that about 10pm but is has not helped  Pt states she continues to have vomiting and diarrhea

## 2015-06-06 NOTE — H&P (Signed)
History and Physical    Sherri Adams [id number] DOB: 1950/05/26 DOA: 05/22/2015  Referring MD/NP/PA: Dr. Roxanne Mins PCP: Vidal Schwalbe, MD Outpatient Specialists: [facility], Dr. Wynetta Emery Patient coming from: Home  Chief Complaint: Intractable diarrhea  HPI: Sherri Adams is a 65 y.o. female with medical history significant of gastric bypass surgery, DM.  Patient presents to the ED with a 9 day history of intractable diarrhea, as well as some nausea and vomiting.  Is able to keep liquids down but they "go right through me".  Zofran provided minimal relief for nausea, Imodium provided no relief for diarrhea.  No recent ABx use nor h/o C.Diff.  Patient was seen by GI Dr. Wynetta Emery and scheduled for endoscopy and colonoscopy next week.  She was seen in the ED yesterday, given hydration and sent home.  ED Course: Patient's lab work demonstrates hypokalemia, NAG metabolic acidosis, elevated creatinine compared to baseline.  IV hydration and electrolyte replacement was started and admission requested due to failed outpatient management.  Review of Systems: As per HPI otherwise 10 point review of systems negative.    Past Medical History  Diagnosis Date  . Thyroid disease   . Hyperlipidemia   . Sinus problem   . Colon polyp   . Arthritis   . Asthma     "perfumes, strong odors"  . Anxiety   . Depression   . 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 Sleep apnea     no cpap  . Family history of anesthesia complication     mother slow to wake  . Diabetes mellitus without complication ([place])   . Sleep apnea   . Cataracts, bilateral   . Skin cancer of face   . Vitamin D deficiency   . 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 History of radiation therapy     tonsils: 81 months of age  . Adenomatous polyps 9/15    Past Surgical History  Procedure Laterality Date  . Nasal sinus surgery      twice  . Thyroidectomy    . Abdominal hysterectomy    . Tubal ligation    .  Tonsilectomy, adenoidectomy, bilateral myringotomy and tubes    . Arm plate      Plate in right arm. Bone was shattered after fall down flight of stairs.  . Cataract surgery Bilateral     right eye  . Gastric bypass      4 yrs ago weight around 250# now and steady  . Tonsillectomy  2000    and adenoids  . Colonoscopy with propofol N/A 11/07/2013    Procedure: COLONOSCOPY WITH PROPOFOL;  Surgeon: Garlan Fair, MD;  Location: [facility];  Service: Endoscopy;  Laterality: N/A;  . Mohs surgery       reports that she quit smoking about 11 years ago. She has never used smokeless tobacco. She reports that she drinks alcohol. She reports that she does not use illicit drugs.  Allergies  Allergen Reactions  . Zithromax [Azithromycin] Anaphylaxis and Itching  . Hydrocodone Itching  . Lipitor [Atorvastatin] Other (See Comments)    "makes me feel like something is crawling all over me"  . Lotensin [Benazepril Hcl] Other (See Comments)    Pt does not remember.   . Omeprazole Diarrhea  . Other Other (See Comments)    Ice Tea-Asthma attack.   . Zocor [Simvastatin] Other (See Comments)    Joint pain.    Family History  Problem Relation Age of Onset  . COPD Father   . Myasthenia gravis Father   . Cancer Father     colon  . Diabetes Father   . Heart disease Father   . Heart disease Mother     CAD  . Alzheimer's disease Mother   . CAD Mother   . Cancer Maternal Grandfather     Brain tumor  . Diabetes Sister   . Deep vein thrombosis Sister   . Obesity Sister   . Thyroid cancer Daughter   . Obesity Sister   . Hypertension Sister   . Obesity Sister   . Thyroid disease Sister   . Hypertension Sister   . Diabetes Sister      Prior to Admission medications   Medication Sig Start Date End Date Taking? Authorizing Provider  acetaminophen (TYLENOL) 500 MG tablet Take 1,000 mg by mouth every 6 (six) hours as needed for moderate pain.   Yes Historical Provider, MD  albuterol  (PROVENTIL HFA;VENTOLIN HFA) 108 (90 BASE) MCG/ACT inhaler Inhale 2 puffs into the lungs every 6 (six) hours as needed for wheezing or shortness of breath.   Yes Historical Provider, MD  buPROPion (WELLBUTRIN XL) 300 MG 24 hr tablet Take 300 mg by mouth daily.   Yes Historical Provider, MD  Cholecalciferol (VITAMIN D3) 5000 UNITS CAPS Take 5,000 Units by mouth daily.    Yes Historical Provider, MD  cyanocobalamin 500 MCG tablet Take 500 mcg by mouth daily.   Yes Historical Provider, MD  fluticasone (FLONASE) 50 MCG/ACT nasal spray Place 2 sprays into both nostrils daily as needed for allergies.  07/17/14  Yes Historical Provider, MD  furosemide (LASIX) 40 MG tablet Take 40 mg by mouth daily as needed for fluid or edema.    Yes Historical Provider, MD  LANTUS SOLOSTAR 100 UNIT/ML Solostar Pen Inject 40 Units into the skin.  06/14/14  Yes Historical Provider, MD  levothyroxine (SYNTHROID, LEVOTHROID) 112 MCG tablet Take 112 mcg by mouth daily before breakfast.   Yes Historical Provider, MD  Multiple Vitamin (MULTIVITAMIN PO) Take 1 tablet by mouth every morning.    Yes Historical Provider, MD  ondansetron (ZOFRAN-ODT) 4 MG disintegrating tablet Take 1 tablet (4 mg total) by mouth once. Patient taking differently: Take 4 mg by mouth every 8 (eight) hours as needed for nausea or vomiting.  06/05/15  Yes Bea Graff Law, PA-C  rosuvastatin (CRESTOR) 20 MG tablet Take 20 mg by mouth daily.   Yes Historical Provider, MD  sitaGLIPtin (JANUVIA) 100 MG tablet Take 1 tablet (100 mg total) by mouth daily. 04/18/15  Yes Philemon Kingdom, MD  metFORMIN (GLUCOPHAGE) 1000 MG tablet TAKE ONE TABLET BY MOUTH TWICE DAILY WITH MEALS Patient not taking: Reported on 06/05/2015 03/07/15   Philemon Kingdom, MD    Physical Exam: Filed Vitals:   06/12/2015 0210  BP: 121/74  Pulse: 117  Temp: 97.6 F (36.4 C)  TempSrc: Oral  Resp: 18  SpO2: 98%      Constitutional: NAD, calm, comfortable Filed Vitals:   05/26/2015 0210    BP: 121/74  Pulse: 117  Temp: 97.6 F (36.4 C)  TempSrc: Oral  Resp: 18  SpO2: 98%   Eyes: PERRL, lids and conjunctivae normal ENMT: Mucous membranes are moist. Posterior pharynx clear of any exudate or lesions.Normal dentition.  Neck: normal, supple, no masses, no thyromegaly Respiratory: clear to auscultation bilaterally, no wheezing, no crackles. Normal respiratory effort. No accessory muscle use.  Cardiovascular: Regular rate and rhythm,  no murmurs / rubs / gallops. No extremity edema. 2+ pedal pulses. No carotid bruits.  Abdomen: no tenderness, no masses palpated. No hepatosplenomegaly. Bowel sounds hyperactive.  Musculoskeletal: no clubbing / cyanosis. No joint deformity upper and lower extremities. Good ROM, no contractures. Normal muscle tone.  Skin: no rashes, lesions, ulcers. No induration Neurologic: CN 2-12 grossly intact. Sensation intact, DTR normal. Strength 5/5 in all 4.  Psychiatric: Normal judgment and insight. Alert and oriented x 3. Normal mood.    Labs on Admission: I have personally reviewed following labs and imaging studies  CBC:  Recent Labs Lab 06/05/15 1151 05/24/2015 0420  WBC 5.4 5.3  NEUTROABS  --  3.7  HGB 12.6 12.6  HCT 37.1 37.9  MCV 79.8 81.3  PLT 390 [id number]   Basic Metabolic Panel:  Recent Labs Lab 06/05/15 1151 06/05/2015 0420  NA 137 141  K 2.8* 3.1*  CL 107 110  CO2 15* 14*  GLUCOSE 198* 240*  BUN 19 21*  CREATININE 1.32* 1.37*  CALCIUM 8.4* 8.5*  MG 0.9* 1.9   GFR: Estimated Creatinine Clearance: 51.6 mL/min (by C-G formula based on Cr of 1.37). Liver Function Tests:  Recent Labs Lab 06/05/15 1151 06/12/2015 0420  AST 15 13*  ALT 13* 12*  ALKPHOS 143* 152*  BILITOT 0.9 1.0  PROT 7.7 8.0  ALBUMIN 4.0 3.9    Recent Labs Lab 06/05/15 1151  LIPASE 20   No results for input(s): AMMONIA in the last 168 hours. Coagulation Profile: No results for input(s): INR, PROTIME in the last 168 hours. Cardiac Enzymes: No  results for input(s): CKTOTAL, CKMB, CKMBINDEX, TROPONINI in the last 168 hours. BNP (last 3 results) No results for input(s): PROBNP in the last 8760 hours. HbA1C: No results for input(s): HGBA1C in the last 72 hours. CBG:  Recent Labs Lab 06/05/2015 0557  GLUCAP 207*   Lipid Profile: No results for input(s): CHOL, HDL, LDLCALC, TRIG, CHOLHDL, LDLDIRECT in the last 72 hours. Thyroid Function Tests: No results for input(s): TSH, T4TOTAL, FREET4, T3FREE, THYROIDAB in the last 72 hours. Anemia Panel: No results for input(s): VITAMINB12, FOLATE, FERRITIN, TIBC, IRON, RETICCTPCT in the last 72 hours. Urine analysis:    Component Value Date/Time   COLORURINE YELLOW 06/05/2015 [place] 06/05/2015 1554   LABSPEC 1.011 06/05/2015 1554   PHURINE 5.5 06/05/2015 1554   GLUCOSEU NEGATIVE 06/05/2015 1554   HGBUR NEGATIVE 06/05/2015 1554   BILIRUBINUR SMALL* 06/05/2015 1554   KETONESUR NEGATIVE 06/05/2015 1554   PROTEINUR NEGATIVE 06/05/2015 1554   NITRITE NEGATIVE 06/05/2015 1554   LEUKOCYTESUR NEGATIVE 06/05/2015 1554   Sepsis Labs: @LABRCNTIP (procalcitonin:4,lacticidven:4) ) Recent Results (from the past 240 hour(s))  C difficile quick scan w PCR reflex     Status: None   Collection Time: 05/26/2015  3:55 AM  Result Value Ref Range Status   C Diff antigen NEGATIVE NEGATIVE Final   C Diff toxin NEGATIVE NEGATIVE Final   C Diff interpretation Negative for toxigenic C. difficile  Final     Radiological Exams on Admission: No results found.  EKG: Independently reviewed.  Assessment/Plan Principal Problem:   Nausea vomiting and diarrhea Active Problems:   Gastric bypass status for obesity   Type 2 diabetes mellitus with hyperglycemia, with long-term current use of insulin (HCC)   Intractable diarrhea   Metabolic acidosis, NAG, bicarbonate losses   Hypokalemia    Intractable diarrhea -  C.Diff negative  GI pathogen pnl ordered and pending  Can try Imodium,  but this has already had essentially no effect for the patient at home  KUB ordered  Should call [facility] later this morning as they are already involved in the patients care with endoscopy's scheduled for early next week.  Would defer decision to CT abd/pelvis to them, holding off on ordering this for the moment.  IVF for hydration, getting 2L bolus in ED followed by 125 cc/hr  Strict intake and output  NAG metabolic acidosis - due to GI losses of bicarbonate  IVF  Repeat BMP at noon  If this does not improve with NS, then may need to add sodium bicarbonate to IVF but wouldn't do this yet.  Nausea and vomiting -  Zofran PRN  Clear liquids only  See diarrhea above  DM2 -  Reduce lantus from 40 qhs to 10 qhs while patient having poor PO intake  Adding sensitive scale SSI Q4H  Holding metformin and januvia  Hypokalemia - replacing potassium   DVT prophylaxis: Heparin Code Status: Full Family Communication: Family at bedside Consults called: None Admission status: Inpatient   Etta Quill DO [facility] Pager [phone] from 7PM-7AM  If 7AM-7PM, please contact the day physician for the patient [email or web address] Password [facility]  05/17/2015, 6:22 AM

## 2015-06-07 ENCOUNTER — Encounter (HOSPITAL_COMMUNITY): Payer: Self-pay | Admitting: Anesthesiology

## 2015-06-07 DIAGNOSIS — E039 Hypothyroidism, unspecified: Secondary | ICD-10-CM

## 2015-06-07 LAB — BASIC METABOLIC PANEL
Anion gap: 11 (ref 5–15)
BUN: 23 mg/dL — AB (ref 6–20)
CALCIUM: 8.6 mg/dL — AB (ref 8.9–10.3)
CO2: 24 mmol/L (ref 22–32)
Chloride: 105 mmol/L (ref 101–111)
Creatinine, Ser: 1.03 mg/dL — ABNORMAL HIGH (ref 0.44–1.00)
GFR calc Af Amer: >60 mL/min (ref >60)
GFR, EST NON AFRICAN AMERICAN: 57 mL/min — AB (ref >60)
GLUCOSE: 254 mg/dL — AB (ref 65–99)
POTASSIUM: 3 mmol/L — AB (ref 3.5–5.1)
SODIUM: 140 mmol/L (ref 135–145)

## 2015-06-07 LAB — GLUCOSE, CAPILLARY
GLUCOSE-CAPILLARY: 269 mg/dL — AB (ref 65–99)
GLUCOSE-CAPILLARY: 214 mg/dL — AB (ref 65–99)
GLUCOSE-CAPILLARY: 118 mg/dL — AB (ref 65–99)
Glucose-Capillary: 233 mg/dL — ABNORMAL HIGH (ref 65–99)

## 2015-06-07 LAB — CBC
HCT: 36 % (ref 36.0–46.0)
Hemoglobin: 12.3 g/dL (ref 12.0–15.0)
MCH: 27.3 pg (ref 26.0–34.0)
MCHC: 34.2 g/dL (ref 30.0–36.0)
MCV: 80 fL (ref 78.0–100.0)
PLATELETS: 329 10*3/uL (ref 150–400)
RBC: 4.5 MIL/uL (ref 3.87–5.11)
RDW: 16.1 % — AB (ref 11.5–15.5)
WBC: 5.7 10*3/uL (ref 4.0–10.5)

## 2015-06-07 LAB — OCCULT BLOOD X 1 CARD TO LAB, STOOL: Fecal Occult Bld: NEGATIVE

## 2015-06-07 LAB — MAGNESIUM: MAGNESIUM: 1.9 mg/dL (ref 1.7–2.4)

## 2015-06-07 LAB — TSH: TSH: 0.143 u[IU]/mL — AB (ref 0.350–4.500)

## 2015-06-07 MED ORDER — INSULIN GLARGINE 100 UNIT/ML ~~LOC~~ SOLN
20.0000 [IU] | Freq: Every day | SUBCUTANEOUS | Status: DC
  Administered 2015-06-07 – 2015-06-08 (×2): 20 [IU] via SUBCUTANEOUS
  Filled 2015-06-07 (×3): qty 0.2

## 2015-06-07 MED ORDER — DIPHENOXYLATE-ATROPINE 2.5-0.025 MG PO TABS
1.0000 | ORAL_TABLET | Freq: Once | ORAL | Status: AC
  Administered 2015-06-07: 1 via ORAL
  Filled 2015-06-07: qty 1

## 2015-06-07 MED ORDER — ONDANSETRON HCL 4 MG/2ML IJ SOLN
4.0000 mg | Freq: Three times a day (TID) | INTRAMUSCULAR | Status: AC | PRN
Start: 2015-06-07 — End: 2015-06-07

## 2015-06-07 MED ORDER — SODIUM CHLORIDE 0.9 % IV SOLN
INTRAVENOUS | Status: DC
  Administered 2015-06-07 – 2015-06-08 (×3): via INTRAVENOUS
  Filled 2015-06-07 (×4): qty 1000

## 2015-06-07 MED ORDER — OCTREOTIDE ACETATE 100 MCG/ML IJ SOLN
100.0000 ug | Freq: Two times a day (BID) | INTRAMUSCULAR | Status: DC
Start: 2015-06-07 — End: 2015-06-12
  Administered 2015-06-07 – 2015-06-08 (×4): 100 ug via SUBCUTANEOUS
  Filled 2015-06-07 (×9): qty 1

## 2015-06-07 MED ORDER — SODIUM CHLORIDE 0.9 % IV BOLUS (SEPSIS)
1000.0000 mL | Freq: Once | INTRAVENOUS | Status: AC
  Administered 2015-06-07: 1000 mL via INTRAVENOUS

## 2015-06-07 MED ORDER — LEVOTHYROXINE SODIUM 100 MCG PO TABS
100.0000 ug | ORAL_TABLET | Freq: Every day | ORAL | Status: DC
  Administered 2015-06-08: 100 ug via ORAL
  Filled 2015-06-07: qty 1

## 2015-06-07 MED ORDER — POTASSIUM CHLORIDE CRYS ER 20 MEQ PO TBCR
40.0000 meq | EXTENDED_RELEASE_TABLET | ORAL | Status: AC
  Administered 2015-06-07 (×3): 40 meq via ORAL
  Filled 2015-06-07 (×3): qty 2

## 2015-06-07 MED ORDER — SODIUM CHLORIDE 0.9 % IV SOLN: INTRAVENOUS | Status: DC

## 2015-06-07 NOTE — Progress Notes (Addendum)
PROGRESS NOTE    Sherri Adams  [id number] DOB: 10/04/1950 DOA: 06/14/2015 PCP: Vidal Schwalbe, MD Outpatient Specialists:    Assessment & Plan:   Principal Problem:   Nausea vomiting and diarrhea Active Problems:   Gastric bypass status for obesity   Type 2 diabetes mellitus with hyperglycemia, with long-term current use of insulin (HCC)   Intractable diarrhea   Metabolic acidosis, NAG, bicarbonate losses   Hypokalemia   Low bicarbonate level   Nausea with vomiting  #1 nausea vomiting diarrhea Patient with improvement and emesis however patient with numerous bowel movements and loose stools overnight and this morning and symptoms have been ongoing for the past 10 days without any significant improvement on conservative treatment. C. difficile PCR was negative. GI pathogen panel is negative. Check a TSH. ?? Malabsorption. Patient has been placed on scheduled Imodium per GI. We'll give a one-time dose of Lomotil.??? Octreotide however will defer to GI. Patient is scheduled for upper endoscopy and colonoscopy on Monday, 15-Jun-2015 per GI. Continue IV fluids, supportive care, probiotics.  #2 metabolic acidosis Likely secondary to GI losses. Patient was placed on a bicarbonate drip with improvement with acidosis. Change IV fluids to normal saline. Follow.  #3 nausea and emesis Supportive care. Antiemetics. Continue clear liquids.  #4 diabetes mellitus type 2 CBGs have ranged from 175-269. Increase Lantus to 20 units daily. Sliding scale insulin.  #5 hypokalemia Replete.  #6 hypothyroidism Check a TSH. Continue home dose Synthroid.   DVT prophylaxis: Heparin Code Status: Full Family Communication: Updated patient at bedside. No family present. Disposition Plan: Home once diarrhea has resolved and per GI.   Consultants:   Gastroenterology: Dr.Bucinni 05/17/2015  Procedures:   Abdominal x-ray 06/05/2015  Antimicrobials:   None   Subjective: Patient with  numerous stools overnight and this morning. Patient denies SOB or CP.  Objective: Filed Vitals:   06/07/2015 1423 06/04/2015 2028 06/07/15 0527 06/07/15 1425  BP: 104/61 115/40 132/64 68/35  Pulse: 87 98 97 98  Temp: 98.3 F (36.8 C) 98 F (36.7 C) 98 F (36.7 C) 98.6 F (37 C)  TempSrc: Oral Oral Oral Oral  Resp: 18 18 18 16   Height:      Weight:      SpO2: 100% 93% 97% 98%    Intake/Output Summary (Last 24 hours) at 06/07/15 1830 Last data filed at 06/07/15 1703  Gross per 24 hour  Intake 1670.83 ml  Output   3628 ml  Net -1957.17 ml   Filed Weights   05/20/2015 1202  Weight: 118.1 kg (260 lb 5.8 oz)    Examination:  General exam: Appears calm and comfortable  Respiratory system: Clear to auscultation. Respiratory effort normal. Cardiovascular system: S1 & S2 heard, RRR. No JVD, murmurs, rubs, gallops or clicks. No pedal edema. Gastrointestinal system: Abdomen is nondistended, soft and nontender. No organomegaly or masses felt. Normal bowel sounds heard. Central nervous system: Alert and oriented. No focal neurological deficits. Extremities: Symmetric 5 x 5 power. Skin: No rashes, lesions or ulcers Psychiatry: Judgement and insight appear normal. Mood & affect appropriate.     Data Reviewed: I have personally reviewed following labs and imaging studies  CBC:  Recent Labs Lab 06/05/15 1151 05/18/2015 0420 06/07/15 0502  WBC 5.4 5.3 5.7  NEUTROABS  --  3.7  --   HGB 12.6 12.6 12.3  HCT 37.1 37.9 36.0  MCV 79.8 81.3 80.0  PLT 390 373 [id number]   Basic Metabolic Panel:  Recent Labs Lab 06/05/15 1151  06/03/2015 0420 06/04/2015 1208 06/07/15 0502  NA 137 141 140 140  K 2.8* 3.1* 3.3* 3.0*  CL 107 110 112* 105  CO2 15* 14* 16* 24  GLUCOSE 198* 240* 178* 254*  BUN 19 21* 17 23*  CREATININE 1.32* 1.37* 1.06* 1.03*  CALCIUM 8.4* 8.5* 7.8* 8.6*  MG 0.9* 1.9  --  1.9   GFR: Estimated Creatinine Clearance: 69.1 mL/min (by C-G formula based on Cr of 1.03). Liver  Function Tests:  Recent Labs Lab 06/05/15 1151 05/21/2015 0420  AST 15 13*  ALT 13* 12*  ALKPHOS 143* 152*  BILITOT 0.9 1.0  PROT 7.7 8.0  ALBUMIN 4.0 3.9    Recent Labs Lab 06/05/15 1151  LIPASE 20   No results for input(s): AMMONIA in the last 168 hours. Coagulation Profile: No results for input(s): INR, PROTIME in the last 168 hours. Cardiac Enzymes: No results for input(s): CKTOTAL, CKMB, CKMBINDEX, TROPONINI in the last 168 hours. BNP (last 3 results) No results for input(s): PROBNP in the last 8760 hours. HbA1C: No results for input(s): HGBA1C in the last 72 hours. CBG:  Recent Labs Lab 06/12/2015 1652 05/24/2015 2105 06/07/15 0813 06/07/15 1212 06/07/15 1652  GLUCAP 162* 175* 269* 233* 214*   Lipid Profile: No results for input(s): CHOL, HDL, LDLCALC, TRIG, CHOLHDL, LDLDIRECT in the last 72 hours. Thyroid Function Tests:  Recent Labs  06/07/15 1428  TSH 0.143*   Anemia Panel: No results for input(s): VITAMINB12, FOLATE, FERRITIN, TIBC, IRON, RETICCTPCT in the last 72 hours. Urine analysis:    Component Value Date/Time   COLORURINE YELLOW 06/05/2015 [place] 06/05/2015 1554   LABSPEC 1.011 06/05/2015 1554   PHURINE 5.5 06/05/2015 1554   GLUCOSEU NEGATIVE 06/05/2015 1554   HGBUR NEGATIVE 06/05/2015 1554   BILIRUBINUR SMALL* 06/05/2015 1554   KETONESUR NEGATIVE 06/05/2015 1554   PROTEINUR NEGATIVE 06/05/2015 1554   NITRITE NEGATIVE 06/05/2015 1554   LEUKOCYTESUR NEGATIVE 06/05/2015 1554   Sepsis Labs: @LABRCNTIP (procalcitonin:4,lacticidven:4)  ) Recent Results (from the past 240 hour(s))  C difficile quick scan w PCR reflex     Status: None   Collection Time: 06/14/2015  3:55 AM  Result Value Ref Range Status   C Diff antigen NEGATIVE NEGATIVE Final   C Diff toxin NEGATIVE NEGATIVE Final   C Diff interpretation Negative for toxigenic C. difficile  Final  Gastrointestinal Panel by PCR , Stool     Status: None   Collection Time:  05/28/2015  3:55 AM  Result Value Ref Range Status   Campylobacter species NOT DETECTED NOT DETECTED Final   Plesimonas shigelloides NOT DETECTED NOT DETECTED Final   Salmonella species NOT DETECTED NOT DETECTED Final   Yersinia enterocolitica NOT DETECTED NOT DETECTED Final   Vibrio species NOT DETECTED NOT DETECTED Final   Vibrio cholerae NOT DETECTED NOT DETECTED Final   Enteroaggregative E coli (EAEC) NOT DETECTED NOT DETECTED Final   Enteropathogenic E coli (EPEC) NOT DETECTED NOT DETECTED Final   Enterotoxigenic E coli (ETEC) NOT DETECTED NOT DETECTED Final   Shiga like toxin producing E coli (STEC) NOT DETECTED NOT DETECTED Final   E. coli O157 NOT DETECTED NOT DETECTED Final   Shigella/Enteroinvasive E coli (EIEC) NOT DETECTED NOT DETECTED Final   Cryptosporidium NOT DETECTED NOT DETECTED Final   Cyclospora cayetanensis NOT DETECTED NOT DETECTED Final   Entamoeba histolytica NOT DETECTED NOT DETECTED Final   Giardia lamblia NOT DETECTED NOT DETECTED Final   Adenovirus F40/41 NOT DETECTED NOT DETECTED Final  Astrovirus NOT DETECTED NOT DETECTED Final   Norovirus GI/GII NOT DETECTED NOT DETECTED Final   Rotavirus A NOT DETECTED NOT DETECTED Final   Sapovirus (I, II, IV, and V) NOT DETECTED NOT DETECTED Final         Radiology Studies: Dg Abd 1 View  06/04/2015  CLINICAL DATA:  Nausea and vomiting for 9 days with weight loss EXAM: ABDOMEN - 1 VIEW COMPARISON:  CT abdomen and pelvis August 06, 2004 FINDINGS: There are loops of dilated proximal to mid small bowel. No appreciable air-fluid level. No free air. No abnormal calcifications. There is degenerative change in both hip joints. IMPRESSION: Dilatation of proximal to mid small bowel. This finding is concerning for ileus or possibly a degree of partial bowel obstruction. No free air. Electronically Signed   By: Lowella Grip III M.D.   On: 06/05/2015 07:02        Scheduled Meds: . acidophilus  1 capsule Oral BID  .  buPROPion  300 mg Oral Daily  . heparin  5,000 Units Subcutaneous Q8H  . insulin aspart  0-9 Units Subcutaneous TID AC & HS  . insulin glargine  20 Units Subcutaneous QHS  . levothyroxine  112 mcg Oral QAC breakfast  . loperamide  4 mg Oral Q6H  . multivitamin  15 mL Oral Daily  . octreotide  100 mcg Subcutaneous Q12H  . potassium chloride  10 mEq Intravenous Once  . rosuvastatin  20 mg Oral q1800  . saccharomyces boulardii  250 mg Oral BID  . sodium chloride  1,000 mL Intravenous Once  . sodium chloride flush  3 mL Intravenous Q12H   Continuous Infusions: . sodium chloride 0.9 % 1,000 mL infusion 125 mL/hr at 06/07/15 0931     LOS: 1 day    Time spent: 33 mins    Mable Lashley, MD [facility] Pager [phone]  If 7PM-7AM, please contact night-coverage [email or web address] Password [facility] 06/07/2015, 6:30 PM

## 2015-06-07 NOTE — Progress Notes (Signed)
GASTROENTEROLOGY PROGRESS NOTE  Problem:   Diarrhea, nausea and vomiting. Iron deficiency anemia with heme-negative stool as outpatient (hemoglobin 10.6, ferritin 3.9 in patient's primary care physician's office earlier this month;Marland Kitchen  Subjective: Not doing well. Nausea and vomiting are worse over the past 24 hours, and diarrhea has persisted despite an increase in her Imodium dose, which does not seem to be helping at all.  Objective: The patient is somewhat drowsy at this time following medication for nausea. She did not sleep well last night. She is in no acute distress.  Hemoglobin stable at 12.3. Potassium low at 3.0.  Assessment: Ongoing nausea, vomiting, and diarrhea, now of 10 days' duration, without response to conservative medical therapy. Stool studies negative for C. difficile and enteric pathogens.  No longer anemic. Hemoglobin improved on iron therapy prior to admission.    Plan: 1. I will check a chromogranin-A level for evidence of a secretory (neuroendocrine) tumor.  2. Anticipate endoscopy and colonoscopy on Monday (scheduled for 11:30 AM). The patient's sister asked about the possibility of doing these tests tomorrow, but since propofol anesthesia is not readily available on the weekend, I feel it is best for this morbidly obese patient to wait until the day after tomorrow. Moreover, it is not clear that the reading of her biopsies would occur any earlier even if the exam was done tomorrow.  3. Dr. Grandville Silos asked about the possibility of empiric octreotide in this patient. The patient, and more especially her sister, would really like to try something stronger than Imodium, so I will go ahead and order octreotide, which is normally well tolerated and will sometimes be effective in nonspecific diarrhea, even if a secretory tumor is not present.  Cleotis Nipper, M.D. 06/07/2015 12:20 PM  Pager [phone] If no answer or after 5 PM call [phone]

## 2015-06-07 NOTE — Anesthesia Preprocedure Evaluation (Deleted)
Anesthesia Evaluation  Patient identified by MRN, date of birth, ID band Patient awake    Reviewed: Allergy & Precautions, H&P , NPO status , Patient's Chart, lab work & pertinent test results  Airway Mallampati: III  TM Distance: >3 FB Neck ROM: full    Dental no notable dental hx. (+) Teeth Intact, Dental Advisory Given   Pulmonary asthma , sleep apnea , former smoker,    Pulmonary exam normal breath sounds clear to auscultation       Cardiovascular Exercise Tolerance: Good hypertension, negative cardio ROS Normal cardiovascular exam Rhythm:regular Rate:Normal     Neuro/Psych negative neurological ROS  negative psych ROS   GI/Hepatic negative GI ROS, Neg liver ROS,   Endo/Other  diabetes, Well Controlled, Type 2, Insulin Dependent, Oral Hypoglycemic AgentsHypothyroidism Morbid obesity  Renal/GU negative Renal ROS  negative genitourinary   Musculoskeletal  (+) Arthritis ,   Abdominal (+) + obese,   Peds  Hematology negative hematology ROS (+)   Anesthesia Other Findings   Reproductive/Obstetrics negative OB ROS                             Anesthesia Physical  Anesthesia Plan  ASA: III  Anesthesia Plan: MAC   Post-op Pain Management:    Induction: Intravenous  Airway Management Planned: Nasal Cannula  Additional Equipment:   Intra-op Plan:   Post-operative Plan:   Informed Consent: I have reviewed the patients History and Physical, chart, labs and discussed the procedure including the risks, benefits and alternatives for the proposed anesthesia with the patient or authorized representative who has indicated his/her understanding and acceptance.   Dental Advisory Given  Plan Discussed with: CRNA and Surgeon  Anesthesia Plan Comments:         Anesthesia Quick Evaluation

## 2015-06-08 LAB — BASIC METABOLIC PANEL
ANION GAP: 5 (ref 5–15)
Anion gap: 15 (ref 5–15)
Anion gap: 14 (ref 5–15)
BUN: 35 mg/dL — AB (ref 6–20)
BUN: 37 mg/dL — AB (ref 6–20)
BUN: 32 mg/dL — ABNORMAL HIGH (ref 6–20)
CALCIUM: 7.3 mg/dL — AB (ref 8.9–10.3)
CALCIUM: 7.4 mg/dL — AB (ref 8.9–10.3)
CHLORIDE: 103 mmol/L (ref 101–111)
CHLORIDE: 102 mmol/L (ref 101–111)
CHLORIDE: 111 mmol/L (ref 101–111)
CO2: 22 mmol/L (ref 22–32)
CO2: 24 mmol/L (ref 22–32)
CO2: 21 mmol/L — AB (ref 22–32)
Calcium: 7.2 mg/dL — ABNORMAL LOW (ref 8.9–10.3)
Creatinine, Ser: 1.81 mg/dL — ABNORMAL HIGH (ref 0.44–1.00)
Creatinine, Ser: 1.67 mg/dL — ABNORMAL HIGH (ref 0.44–1.00)
Creatinine, Ser: 1.19 mg/dL — ABNORMAL HIGH (ref 0.44–1.00)
GFR calc Af Amer: 33 mL/min — ABNORMAL LOW (ref >60)
GFR calc Af Amer: 37 mL/min — ABNORMAL LOW (ref >60)
GFR calc non Af Amer: 29 mL/min — ABNORMAL LOW (ref >60)
GFR calc non Af Amer: 32 mL/min — ABNORMAL LOW (ref >60)
GFR calc non Af Amer: 48 mL/min — ABNORMAL LOW (ref >60)
GFR, EST AFRICAN AMERICAN: 56 mL/min — AB (ref >60)
GLUCOSE: 193 mg/dL — AB (ref 65–99)
Glucose, Bld: 181 mg/dL — ABNORMAL HIGH (ref 65–99)
Glucose, Bld: 131 mg/dL — ABNORMAL HIGH (ref 65–99)
POTASSIUM: 2.3 mmol/L — AB (ref 3.5–5.1)
POTASSIUM: 2.6 mmol/L — AB (ref 3.5–5.1)
Potassium: 3.9 mmol/L (ref 3.5–5.1)
SODIUM: 140 mmol/L (ref 135–145)
SODIUM: 140 mmol/L (ref 135–145)
SODIUM: 137 mmol/L (ref 135–145)

## 2015-06-08 LAB — CBC
HEMATOCRIT: 36 % (ref 36.0–46.0)
Hemoglobin: 12 g/dL (ref 12.0–15.0)
MCH: 27.1 pg (ref 26.0–34.0)
MCHC: 33.3 g/dL (ref 30.0–36.0)
MCV: 81.3 fL (ref 78.0–100.0)
Platelets: 296 10*3/uL (ref 150–400)
RBC: 4.43 MIL/uL (ref 3.87–5.11)
RDW: 16.4 % — AB (ref 11.5–15.5)
WBC: 11.4 10*3/uL — AB (ref 4.0–10.5)

## 2015-06-08 LAB — GLUCOSE, CAPILLARY
GLUCOSE-CAPILLARY: 172 mg/dL — AB (ref 65–99)
GLUCOSE-CAPILLARY: 150 mg/dL — AB (ref 65–99)
GLUCOSE-CAPILLARY: 139 mg/dL — AB (ref 65–99)
Glucose-Capillary: 186 mg/dL — ABNORMAL HIGH (ref 65–99)

## 2015-06-08 LAB — MAGNESIUM: Magnesium: 1.7 mg/dL (ref 1.7–2.4)

## 2015-06-08 MED ORDER — ONDANSETRON HCL 4 MG PO TABS
8.0000 mg | ORAL_TABLET | Freq: Four times a day (QID) | ORAL | Status: DC | PRN
  Administered 2015-06-08: 8 mg via ORAL
  Filled 2015-06-08: qty 2

## 2015-06-08 MED ORDER — POTASSIUM CHLORIDE 10 MEQ/100ML IV SOLN
10.0000 meq | INTRAVENOUS | Status: AC
  Administered 2015-06-08 (×6): 10 meq via INTRAVENOUS
  Filled 2015-06-08 (×6): qty 100

## 2015-06-08 MED ORDER — MAGNESIUM SULFATE 2 GM/50ML IV SOLN
2.0000 g | Freq: Once | INTRAVENOUS | Status: AC
  Administered 2015-06-08: 2 g via INTRAVENOUS
  Filled 2015-06-08: qty 50

## 2015-06-08 MED ORDER — POTASSIUM CHLORIDE CRYS ER 20 MEQ PO TBCR: 40.0000 meq | EXTENDED_RELEASE_TABLET | ORAL | Status: DC

## 2015-06-08 MED ORDER — ONDANSETRON HCL 40 MG/20ML IJ SOLN
8.0000 mg | Freq: Three times a day (TID) | INTRAMUSCULAR | Status: AC
Start: 2015-06-08 — End: 2015-06-10
  Administered 2015-06-08: 8 mg via INTRAVENOUS
  Filled 2015-06-08 (×4): qty 4

## 2015-06-08 MED ORDER — SODIUM CHLORIDE 0.9 % IV BOLUS (SEPSIS)
1000.0000 mL | Freq: Once | INTRAVENOUS | Status: AC
  Administered 2015-06-08: 1000 mL via INTRAVENOUS

## 2015-06-08 MED ORDER — SODIUM CHLORIDE 0.9 % IV SOLN: INTRAVENOUS | Status: DC

## 2015-06-08 MED ORDER — SODIUM CHLORIDE 0.9 % IV SOLN
INTRAVENOUS | Status: DC
  Administered 2015-06-09 (×2): via INTRAVENOUS

## 2015-06-08 MED ORDER — POTASSIUM CHLORIDE CRYS ER 20 MEQ PO TBCR
40.0000 meq | EXTENDED_RELEASE_TABLET | Freq: Three times a day (TID) | ORAL | Status: DC
  Administered 2015-06-08 (×2): 40 meq via ORAL
  Filled 2015-06-08 (×2): qty 2

## 2015-06-08 MED ORDER — POTASSIUM CHLORIDE IN NACL 40-0.9 MEQ/L-% IV SOLN
INTRAVENOUS | Status: DC
  Administered 2015-06-08 – 2015-06-09 (×2): 150 mL/h via INTRAVENOUS
  Filled 2015-06-08 (×16): qty 1000

## 2015-06-08 MED ORDER — PROCHLORPERAZINE EDISYLATE 5 MG/ML IJ SOLN
10.0000 mg | Freq: Four times a day (QID) | INTRAMUSCULAR | Status: DC | PRN
Start: 2015-06-08 — End: 2015-06-12
  Administered 2015-06-08: 10 mg via INTRAVENOUS
  Filled 2015-06-08 (×2): qty 2

## 2015-06-08 MED ORDER — SODIUM CHLORIDE 0.9 % IV SOLN: 8.0000 mg | Freq: Four times a day (QID) | INTRAVENOUS | Status: DC | PRN

## 2015-06-08 MED ORDER — POTASSIUM CHLORIDE 20 MEQ/15ML (10%) PO SOLN
40.0000 meq | Freq: Three times a day (TID) | ORAL | Status: DC
  Administered 2015-06-08 (×2): 40 meq via ORAL
  Filled 2015-06-08 (×2): qty 30

## 2015-06-08 NOTE — Progress Notes (Signed)
CRITICAL VALUE ALERT  Critical value received:  K+ 2.3  Date of notification:  06/08/2015  Time of notification:  0627  Critical value read back:Yes.    Nurse who received alert:  J.Lorrine Killilea  MD notified (1st page):  K.Schorr  Time of first page:  0631  MD notified (2nd page):  Time of second page:  Responding MD:  K.Schorr  Time MD responded:  [id number] runs IV potassium ordered. Will carry out new orders.

## 2015-06-08 NOTE — Progress Notes (Addendum)
BP was 89/45 at beginning of shift, gave 2000 cc bolus and came up to 112/50. BP back down to 90/54 when checked with night time vitals, K.Schorr paged and another 1000 cc bolus given. BP came up to 122/43.Will recheck BP in morning and continue to monitor.

## 2015-06-08 NOTE — Progress Notes (Signed)
Patient c/o inability to urinate. Performed bladder scan on patient.  Pre-void volume was measured at 475 ml with bladder scanner.  Patient was able to void 450 of dark amber urine.  Post-void residual was 0 ml.   Patient has had 3 episodes of emesis today.  Dr. Grandville Silos aware.  New orders received; will continue to monitor patient.

## 2015-06-08 NOTE — Progress Notes (Signed)
CRITICAL VALUE ALERT  Critical value received:  K+ 2.6  Date of notification:  06/08/2015  Time of notification:  11:42 am  Critical value read back:Yes.    Nurse who received alert:  Pearla Dubonnet RN  MD notified (1st page):  Dr. Grandville Silos  Time of first page:  11:43 am  MD notified (2nd page):  Time of second page:  Responding MD:  Grandville Silos   Time MD responded:  11:44pm  New orders received. Continue with administering potassium

## 2015-06-08 NOTE — Progress Notes (Signed)
Initial Nutrition Assessment  DOCUMENTATION CODES:   Morbid obesity  INTERVENTION:   Diet advancement per MD  If patient unable to tolerate PO intake, may need to consider nutrition support RD to continue to monitor for plan.  NUTRITION DIAGNOSIS:   Inadequate oral intake related to nausea, vomiting as evidenced by per patient/family report.  GOAL:   Patient will meet greater than or equal to 90% of their needs  MONITOR:   PO intake, Diet advancement, Labs, Weight trends, I & O's  REASON FOR ASSESSMENT:   Malnutrition Screening Tool    ASSESSMENT:   65 y.o. female with medical history significant of gastric bypass surgery, DM. Patient presents to the ED with a 9 day history of intractable diarrhea, as well as some nausea and vomiting. Is able to keep liquids down but they "go right through me". Zofran provided minimal relief for nausea, Imodium provided no relief for diarrhea.  Patient in room with family at bedside. Pt reports not tolerating clear liquids, states she tried a popsicle earlier. Now she is only sipping water and ice chips. Last solid food she had was dry toast on 4/20. Pt with chronic diarrhea since 4/12. Pt states she was placed on iron supplementation in January since her iron and hemoglobin levels were low, reports the supplements did not help her levels. Pt states she was taking her vitamin regimen like prescribed, had her gastric bypass surgery in 2012 and has not followed-up with an RD since 2012 as well.   Recommend once patient is able to tolerate intakes, provide calcium citrate supplements and bariatric protein supplements. If unable to have PO intake for up to 8 days, may need to consider nutrition support.  Pt reports UBW of 250-275 lb, weight tends to fluctuate but she has never had weight loss as quickly as in the past 2 weeks. 7% x 2 weeks, significant for time frame.  Pt with no muscle or fat depletion.  Medications: Imodium capsule every 6  hours,  Liquid Multivitamin daily, Ocreotide injection BID, K-DUR tablet TID, NaCl w/ KCl 40 infusion @ 150 ml/hr  Labs reviewed: CBGs: 118-186 Low K Mg WNL  Diet Order:  Diet clear liquid Room service appropriate?: Yes; Fluid consistency:: Thin  Skin:  Reviewed, no issues  Last BM:  4/22  Height:   Ht Readings from Last 1 Encounters:  05/21/2015 5\' 2"  (1.575 m)    Weight:   Wt Readings from Last 1 Encounters:  05/22/2015 260 lb 5.8 oz (118.1 kg)    Ideal Body Weight:  50 kg  BMI:  Body mass index is 47.61 kg/(m^2).  Estimated Nutritional Needs:   Kcal:  1700-1900  Protein:  65-75g  Fluid:  2L/day  EDUCATION NEEDS:   No education needs identified at this time  Clayton Bibles, MS, RD, LDN Pager: [phone] After Hours Pager: [phone]

## 2015-06-08 NOTE — Progress Notes (Signed)
GASTROENTEROLOGY PROGRESS NOTE  Problem:   Severe diarrhea, acute onset.  Nausea with vomiting. Hypokalemia. History of mild iron deficiency anemia, status post gastric bypass surgery.  To recall, the patient had the abrupt onset of diarrhea, with some nausea and dry heaves, about 10 days ago. Initially, it was intermittent, now the diarrhea has become continuous and is watery in character, not associated with significant abdominal pain.  The patient had recently seen Dr. Howell Rucks in the office, on referral for mild iron deficiency anemia (actually, anemia resolved by time of this admission, following iron therapy). His plan had been to do endoscopy and colonoscopy in the near future. The patient has been heme negative on this admission.  Subjective: The patient had a Flexi-Seal placed yesterday and put out over 4 L of liquid stool, despite high-dose Imodium and institution of octreotide 100 g subcutaneous twice a day.  She has nausea and dry heaves, requiring ongoing anti-nausea therapy However, she has not had frank vomiting.  Objective: Massive stool output as noted. In the Flexi-Seal tubing and collection bag, it looks like beef broth.  Potassium this morning is 2.3, despite attempts at supplementation yesterday.  Assessment: 1. Severe watery diarrhea, with associated nausea and vomiting, Hemoccult negative, stool studies negative although initially this sounded like an infectious process.  I'm now wondering about the possibility of some sort of secretory diarrhea, although the absence of a response to octreotide would go against that to some degree.  2. Severe hypokalemia  3. History of iron deficiency anemia, resolved, Hemoccult negative on this admission probably a consequence of her gastric bypass surgery.  Plan: 1. Aggressive potassium repletion today; recheck level this evening and again tomorrow morning, in anticipation of endoscopic procedures at 11:30 tomorrow  morning.  2. Upper endoscopy with random small bowel biopsies, and colonoscopy with random mucosal biopsies, tomorrow. Risks reviewed, patient agreeable. Would recommend that a "rush" be put on the pathology request.  3. Wait chromogranin-A result. Consider CT scan to look for secretory tumor  4. Consider trial of rifaximin for possible small intestinal bacterial overgrowth, status post gastric bypass surgery.  Cleotis Nipper, M.D. 06/08/2015 11:09 AM  Pager [phone] If no answer or after 5 PM call [phone]

## 2015-06-08 NOTE — Progress Notes (Addendum)
PROGRESS NOTE    Sherri Adams  [id number] DOB: 1950/04/02 DOA: 06/13/2015 PCP: Vidal Schwalbe, MD Outpatient Specialists:    Assessment & Plan:   Principal Problem:   Nausea vomiting and diarrhea Active Problems:   Gastric bypass status for obesity   Type 2 diabetes mellitus with hyperglycemia, with long-term current use of insulin (HCC)   Intractable diarrhea   Metabolic acidosis, NAG, bicarbonate losses   Hypokalemia   Low bicarbonate level   Nausea with vomiting   Thyroid activity decreased  #1 nausea vomiting diarrhea Patient with improvement and emesis however patient with numerous bowel movements and loose stools overnight and this morning. Patient had 4.4 L out in stool. Patient's symptoms have been ongoing for the past 10 days without any significant improvement on conservative treatment. C. difficile PCR was negative. GI pathogen panel is negative. TSH was decreased. ?? Malabsorption. Patient has been placed on scheduled Imodium per GI. Patient also placed on octreotide per GI. GI has ordered some labs which are pending. Patient is scheduled for upper endoscopy and colonoscopy on Monday, 06-24-15 per GI. Continue IV fluids, supportive care, probiotics. Per GI.  #2 metabolic acidosis Likely secondary to GI losses. Patient was placed on a bicarbonate drip with improvement with acidosis. Bicarbonate drip has been discontinued. IV fluids.   #3 nausea and emesis Patient with ongoing nausea. Will place on scheduled Zofran 8 mg 3 times daily. Compazine 10 mg IV every 6 hours when necessary. Supportive care. Continue clear liquids.  #4 diabetes mellitus type 2 CBGs have ranged from 150-172. Increased Lantus to 20 units daily. Sliding scale insulin.  #5 severe hypokalemia Secondary to GI losses. Replete.  #6 hypothyroidism TSH 0.143. Decrease Synthroid to 100 g daily.  #7 acute kidney injury Secondary to prerenal azotemia secondary to GI losses. Increase IV fluids  to 150 mL per hour. Follow.   DVT prophylaxis: Heparin Code Status: Full Family Communication: Updated patient and family at bedside. Disposition Plan: Home once diarrhea has resolved and per GI.   Consultants:   Gastroenterology: Dr.Bucinni 05/27/2015  Procedures:   Abdominal x-ray 05/26/2015  Antimicrobials:   None   Subjective: Patient with numerous stools overnight and this morning. Patient stated emesis improved however still nauseous. Patient denies SOB or CP.  Objective: Filed Vitals:   06/07/15 2127 06/07/15 2203 06/08/15 0029 06/08/15 0407  BP: 112/50 90/54 122/43 115/48  Pulse: 88 90  88  Temp:  98.7 F (37.1 C)  98.3 F (36.8 C)  TempSrc:  Oral  Oral  Resp:  20  20  Height:      Weight:      SpO2:  94%  93%    Intake/Output Summary (Last 24 hours) at 06/08/15 1253 Last data filed at 06/08/15 1100  Gross per 24 hour  Intake 5129.17 ml  Output   5050 ml  Net  79.17 ml   Filed Weights   05/26/2015 1202  Weight: 118.1 kg (260 lb 5.8 oz)    Examination:  General exam: Appears calm and comfortable  Respiratory system: Clear to auscultation. Respiratory effort normal. Cardiovascular system: S1 & S2 heard, RRR. No JVD, murmurs, rubs, gallops or clicks. No pedal edema. Gastrointestinal system: Abdomen is nondistended, soft and nontender. No organomegaly or masses felt. Normal bowel sounds heard. Central nervous system: Alert and oriented. No focal neurological deficits. Extremities: Symmetric 5 x 5 power. Skin: No rashes, lesions or ulcers Psychiatry: Judgement and insight appear normal. Mood & affect appropriate.  Data Reviewed: I have personally reviewed following labs and imaging studies  CBC:  Recent Labs Lab 06/05/15 1151 05/19/2015 0420 06/07/15 0502 06/08/15 0510  WBC 5.4 5.3 5.7 11.4*  NEUTROABS  --  3.7  --   --   HGB 12.6 12.6 12.3 12.0  HCT 37.1 37.9 36.0 36.0  MCV 79.8 81.3 80.0 81.3  PLT 390 373 329 0000000   Basic Metabolic  Panel:  Recent Labs Lab 06/05/15 1151 05/31/2015 0420 06/05/2015 1208 06/07/15 0502 06/08/15 0510 06/08/15 1051  NA 137 141 140 140 140 140  K 2.8* 3.1* 3.3* 3.0* 2.3* 2.6*  CL 107 110 112* 105 103 102  CO2 15* 14* 16* 24 22 24   GLUCOSE 198* 240* 178* 254* 193* 181*  BUN 19 21* 17 23* 35* 37*  CREATININE 1.32* 1.37* 1.06* 1.03* 1.81* 1.67*  CALCIUM 8.4* 8.5* 7.8* 8.6* 7.2* 7.3*  MG 0.9* 1.9  --  1.9 1.7  --    GFR: Estimated Creatinine Clearance: 42.6 mL/min (by C-G formula based on Cr of 1.67). Liver Function Tests:  Recent Labs Lab 06/05/15 1151 06/12/2015 0420  AST 15 13*  ALT 13* 12*  ALKPHOS 143* 152*  BILITOT 0.9 1.0  PROT 7.7 8.0  ALBUMIN 4.0 3.9    Recent Labs Lab 06/05/15 1151  LIPASE 20   No results for input(s): AMMONIA in the last 168 hours. Coagulation Profile: No results for input(s): INR, PROTIME in the last 168 hours. Cardiac Enzymes: No results for input(s): CKTOTAL, CKMB, CKMBINDEX, TROPONINI in the last 168 hours. BNP (last 3 results) No results for input(s): PROBNP in the last 8760 hours. HbA1C: No results for input(s): HGBA1C in the last 72 hours. CBG:  Recent Labs Lab 06/07/15 0813 06/07/15 1212 06/07/15 1652 06/07/15 2207 06/08/15 0744  GLUCAP 269* 233* 214* 118* 186*   Lipid Profile: No results for input(s): CHOL, HDL, LDLCALC, TRIG, CHOLHDL, LDLDIRECT in the last 72 hours. Thyroid Function Tests:  Recent Labs  06/07/15 1428  TSH 0.143*   Anemia Panel: No results for input(s): VITAMINB12, FOLATE, FERRITIN, TIBC, IRON, RETICCTPCT in the last 72 hours. Urine analysis:    Component Value Date/Time   COLORURINE YELLOW 06/05/2015 [place] 06/05/2015 1554   LABSPEC 1.011 06/05/2015 1554   PHURINE 5.5 06/05/2015 1554   GLUCOSEU NEGATIVE 06/05/2015 1554   HGBUR NEGATIVE 06/05/2015 1554   BILIRUBINUR SMALL* 06/05/2015 1554   KETONESUR NEGATIVE 06/05/2015 1554   PROTEINUR NEGATIVE 06/05/2015 1554   NITRITE  NEGATIVE 06/05/2015 1554   LEUKOCYTESUR NEGATIVE 06/05/2015 1554   Sepsis Labs: @LABRCNTIP (procalcitonin:4,lacticidven:4)  ) Recent Results (from the past 240 hour(s))  C difficile quick scan w PCR reflex     Status: None   Collection Time: 05/29/2015  3:55 AM  Result Value Ref Range Status   C Diff antigen NEGATIVE NEGATIVE Final   C Diff toxin NEGATIVE NEGATIVE Final   C Diff interpretation Negative for toxigenic C. difficile  Final  Gastrointestinal Panel by PCR , Stool     Status: None   Collection Time: 05/27/2015  3:55 AM  Result Value Ref Range Status   Campylobacter species NOT DETECTED NOT DETECTED Final   Plesimonas shigelloides NOT DETECTED NOT DETECTED Final   Salmonella species NOT DETECTED NOT DETECTED Final   Yersinia enterocolitica NOT DETECTED NOT DETECTED Final   Vibrio species NOT DETECTED NOT DETECTED Final   Vibrio cholerae NOT DETECTED NOT DETECTED Final   Enteroaggregative E coli (EAEC) NOT DETECTED NOT DETECTED  Final   Enteropathogenic E coli (EPEC) NOT DETECTED NOT DETECTED Final   Enterotoxigenic E coli (ETEC) NOT DETECTED NOT DETECTED Final   Shiga like toxin producing E coli (STEC) NOT DETECTED NOT DETECTED Final   E. coli O157 NOT DETECTED NOT DETECTED Final   Shigella/Enteroinvasive E coli (EIEC) NOT DETECTED NOT DETECTED Final   Cryptosporidium NOT DETECTED NOT DETECTED Final   Cyclospora cayetanensis NOT DETECTED NOT DETECTED Final   Entamoeba histolytica NOT DETECTED NOT DETECTED Final   Giardia lamblia NOT DETECTED NOT DETECTED Final   Adenovirus F40/41 NOT DETECTED NOT DETECTED Final   Astrovirus NOT DETECTED NOT DETECTED Final   Norovirus GI/GII NOT DETECTED NOT DETECTED Final   Rotavirus A NOT DETECTED NOT DETECTED Final   Sapovirus (I, II, IV, and V) NOT DETECTED NOT DETECTED Final         Radiology Studies: No results found.      Scheduled Meds: . acidophilus  1 capsule Oral BID  . buPROPion  300 mg Oral Daily  . heparin  5,000  Units Subcutaneous Q8H  . insulin aspart  0-9 Units Subcutaneous TID AC & HS  . insulin glargine  20 Units Subcutaneous QHS  . levothyroxine  100 mcg Oral QAC breakfast  . loperamide  4 mg Oral Q6H  . multivitamin  15 mL Oral Daily  . octreotide  100 mcg Subcutaneous Q12H  . potassium chloride  10 mEq Intravenous Once  . potassium chloride  10 mEq Intravenous Q1 Hr x 6  . potassium chloride  40 mEq Oral TID  . rosuvastatin  20 mg Oral q1800  . saccharomyces boulardii  250 mg Oral BID  . sodium chloride flush  3 mL Intravenous Q12H   Continuous Infusions: . 0.9 % NaCl with KCl 40 mEq / L 150 mL/hr (06/08/15 1208)     LOS: 2 days    Time spent: 42 mins    THOMPSON,DANIEL, MD [facility] Pager [phone]  If 7PM-7AM, please contact night-coverage [email or web address] Password [facility] 06/08/2015, 12:53 PM

## 2015-06-09 ENCOUNTER — Inpatient Hospital Stay (HOSPITAL_COMMUNITY): Payer: BLUE CROSS/BLUE SHIELD | Admitting: Anesthesiology

## 2015-06-09 ENCOUNTER — Inpatient Hospital Stay (HOSPITAL_COMMUNITY): Payer: BLUE CROSS/BLUE SHIELD

## 2015-06-09 ENCOUNTER — Encounter (HOSPITAL_COMMUNITY): Admission: EM | Disposition: E | Payer: Self-pay | Source: Home / Self Care | Attending: Internal Medicine

## 2015-06-09 ENCOUNTER — Encounter (HOSPITAL_COMMUNITY): Payer: Self-pay

## 2015-06-09 ENCOUNTER — Telehealth (HOSPITAL_BASED_OUTPATIENT_CLINIC_OR_DEPARTMENT_OTHER): Payer: Self-pay | Admitting: Emergency Medicine

## 2015-06-09 DIAGNOSIS — E875 Hyperkalemia: Secondary | ICD-10-CM

## 2015-06-09 DIAGNOSIS — I998 Other disorder of circulatory system: Secondary | ICD-10-CM

## 2015-06-09 DIAGNOSIS — I469 Cardiac arrest, cause unspecified: Secondary | ICD-10-CM

## 2015-06-09 DIAGNOSIS — I4901 Ventricular fibrillation: Secondary | ICD-10-CM

## 2015-06-09 HISTORY — PX: INSERTION OF DIALYSIS CATHETER: SHX1324

## 2015-06-09 HISTORY — PX: EMBOLECTOMY: SHX44

## 2015-06-09 HISTORY — PX: FASCIOTOMY: SHX132

## 2015-06-09 LAB — BASIC METABOLIC PANEL
ANION GAP: 7 (ref 5–15)
BUN: 32 mg/dL — ABNORMAL HIGH (ref 6–20)
CO2: 20 mmol/L — AB (ref 22–32)
Calcium: 7.7 mg/dL — ABNORMAL LOW (ref 8.9–10.3)
Chloride: 114 mmol/L — ABNORMAL HIGH (ref 101–111)
Creatinine, Ser: 1.07 mg/dL — ABNORMAL HIGH (ref 0.44–1.00)
GFR calc Af Amer: >60 mL/min (ref >60)
GFR calc non Af Amer: 54 mL/min — ABNORMAL LOW (ref >60)
GLUCOSE: 146 mg/dL — AB (ref 65–99)
POTASSIUM: 4.3 mmol/L (ref 3.5–5.1)
Sodium: 141 mmol/L (ref 135–145)

## 2015-06-09 LAB — TYPE AND SCREEN
ABO/RH(D): B POS
ANTIBODY SCREEN: NEGATIVE

## 2015-06-09 LAB — POCT I-STAT 4, (NA,K, GLUC, HGB,HCT)
GLUCOSE: 214 mg/dL — AB (ref 65–99)
Glucose, Bld: 171 mg/dL — ABNORMAL HIGH (ref 65–99)
Glucose, Bld: 187 mg/dL — ABNORMAL HIGH (ref 65–99)
HCT: 33 % — ABNORMAL LOW (ref 36.0–46.0)
HEMATOCRIT: 35 % — AB (ref 36.0–46.0)
HEMATOCRIT: 32 % — AB (ref 36.0–46.0)
HEMOGLOBIN: 11.9 g/dL — AB (ref 12.0–15.0)
HEMOGLOBIN: 10.9 g/dL — AB (ref 12.0–15.0)
Hemoglobin: 11.2 g/dL — ABNORMAL LOW (ref 12.0–15.0)
Potassium: 4.6 mmol/L (ref 3.5–5.1)
Potassium: 8.3 mmol/L (ref 3.5–5.1)
Potassium: 7.9 mmol/L (ref 3.5–5.1)
SODIUM: 137 mmol/L (ref 135–145)
Sodium: 142 mmol/L (ref 135–145)
Sodium: 141 mmol/L (ref 135–145)

## 2015-06-09 LAB — POCT I-STAT 7, (LYTES, BLD GAS, ICA,H+H)
ACID-BASE DEFICIT: 3 mmol/L — AB (ref 0.0–2.0)
Acid-base deficit: 9 mmol/L — ABNORMAL HIGH (ref 0.0–2.0)
Acid-base deficit: 1 mmol/L (ref 0.0–2.0)
BICARBONATE: 19.2 meq/L — AB (ref 20.0–24.0)
BICARBONATE: 26 meq/L — AB (ref 20.0–24.0)
Bicarbonate: 24.8 mEq/L — ABNORMAL HIGH (ref 20.0–24.0)
CALCIUM ION: 1.1 mmol/L — AB (ref 1.13–1.30)
CALCIUM ION: 1.07 mmol/L — AB (ref 1.13–1.30)
Calcium, Ion: 0.96 mmol/L — ABNORMAL LOW (ref 1.13–1.30)
HCT: 28 % — ABNORMAL LOW (ref 36.0–46.0)
HCT: 27 % — ABNORMAL LOW (ref 36.0–46.0)
HEMATOCRIT: 29 % — AB (ref 36.0–46.0)
HEMOGLOBIN: 9.5 g/dL — AB (ref 12.0–15.0)
Hemoglobin: 9.9 g/dL — ABNORMAL LOW (ref 12.0–15.0)
Hemoglobin: 9.2 g/dL — ABNORMAL LOW (ref 12.0–15.0)
O2 SAT: 98 %
O2 SAT: 94 %
O2 Saturation: 96 %
PCO2 ART: 50.9 mmHg — AB (ref 35.0–45.0)
PH ART: 7.272 — AB (ref 7.350–7.450)
PH ART: 7.313 — AB (ref 7.350–7.450)
PO2 ART: 77 mmHg — AB (ref 80.0–100.0)
POTASSIUM: 8.2 mmol/L — AB (ref 3.5–5.1)
POTASSIUM: 8.3 mmol/L — AB (ref 3.5–5.1)
Patient temperature: 36.4
Patient temperature: 36.4
Potassium: 7.8 mmol/L (ref 3.5–5.1)
SODIUM: 136 mmol/L (ref 135–145)
Sodium: 138 mmol/L (ref 135–145)
Sodium: 140 mmol/L (ref 135–145)
TCO2: 21 mmol/L (ref 0–100)
TCO2: 26 mmol/L (ref 0–100)
TCO2: 28 mmol/L (ref 0–100)
pCO2 arterial: 49.2 mmHg — ABNORMAL HIGH (ref 35.0–45.0)
pCO2 arterial: 53.3 mmHg — ABNORMAL HIGH (ref 35.0–45.0)
pH, Arterial: 7.197 — CL (ref 7.350–7.450)
pO2, Arterial: 110 mmHg — ABNORMAL HIGH (ref 80.0–100.0)
pO2, Arterial: 97 mmHg (ref 80.0–100.0)

## 2015-06-09 LAB — CBC
HEMATOCRIT: 34.9 % — AB (ref 36.0–46.0)
HEMOGLOBIN: 11.7 g/dL — AB (ref 12.0–15.0)
MCH: 26.5 pg (ref 26.0–34.0)
MCHC: 33.5 g/dL (ref 30.0–36.0)
MCV: 79.1 fL (ref 78.0–100.0)
Platelets: 256 10*3/uL (ref 150–400)
RBC: 4.41 MIL/uL (ref 3.87–5.11)
RDW: 16.3 % — ABNORMAL HIGH (ref 11.5–15.5)
WBC: 13.7 10*3/uL — ABNORMAL HIGH (ref 4.0–10.5)

## 2015-06-09 LAB — GLUCOSE, CAPILLARY: GLUCOSE-CAPILLARY: 148 mg/dL — AB (ref 65–99)

## 2015-06-09 LAB — TROPONIN I: Troponin I: 0.03 ng/mL (ref <0.031)

## 2015-06-09 LAB — LACTIC ACID, PLASMA: Lactic Acid, Venous: 2 mmol/L (ref 0.5–2.0)

## 2015-06-09 LAB — MAGNESIUM: Magnesium: 2.2 mg/dL (ref 1.7–2.4)

## 2015-06-09 LAB — ABO/RH: ABO/RH(D): B POS

## 2015-06-09 LAB — PROTIME-INR
INR: 1.4 (ref 0.00–1.49)
Prothrombin Time: 16.8 seconds — ABNORMAL HIGH (ref 11.6–15.2)

## 2015-06-09 SURGERY — ESOPHAGOGASTRODUODENOSCOPY (EGD) WITH PROPOFOL
Anesthesia: Monitor Anesthesia Care

## 2015-06-09 SURGERY — EMBOLECTOMY
Anesthesia: General | Site: Leg Upper | Laterality: Left

## 2015-06-09 MED ORDER — FUROSEMIDE 10 MG/ML IJ SOLN
INTRAMUSCULAR | Status: AC
  Filled 2015-06-09: qty 4

## 2015-06-09 MED ORDER — HEPARIN SODIUM (PORCINE) 1000 UNIT/ML IJ SOLN
INTRAMUSCULAR | Status: DC | PRN
  Administered 2015-06-09 (×2): 10000 [IU] via INTRAVENOUS

## 2015-06-09 MED ORDER — FUROSEMIDE 10 MG/ML IJ SOLN
INTRAMUSCULAR | Status: DC | PRN
  Administered 2015-06-09: 80 mg via INTRAMUSCULAR
  Administered 2015-06-09: 20 mg via INTRAMUSCULAR
  Administered 2015-06-09: 40 mg via INTRAMUSCULAR

## 2015-06-09 MED ORDER — SODIUM CHLORIDE 0.9 % IV SOLN
INTRAVENOUS | Status: DC
  Administered 2015-06-09: 5 [IU]/h via INTRAVENOUS
  Filled 2015-06-09: qty 2.5

## 2015-06-09 MED ORDER — SODIUM CHLORIDE 0.9 % IV SOLN
INTRAVENOUS | Status: DC | PRN
  Administered 2015-06-09: 500 mL

## 2015-06-09 MED ORDER — 0.9 % SODIUM CHLORIDE (POUR BTL) OPTIME
TOPICAL | Status: DC | PRN
  Administered 2015-06-09: 2000 mL

## 2015-06-09 MED ORDER — LACTATED RINGERS IV SOLN
INTRAVENOUS | Status: DC | PRN
  Administered 2015-06-09 (×2): via INTRAVENOUS

## 2015-06-09 MED ORDER — CEFAZOLIN SODIUM 1 G IJ SOLR
INTRAMUSCULAR | Status: DC | PRN
  Administered 2015-06-09: 2 g via INTRAMUSCULAR

## 2015-06-09 MED ORDER — PHENYLEPHRINE HCL 10 MG/ML IJ SOLN
10.0000 mg | INTRAVENOUS | Status: DC | PRN
  Administered 2015-06-09: 15 ug/min via INTRAVENOUS

## 2015-06-09 MED ORDER — MIDAZOLAM HCL 2 MG/2ML IJ SOLN
INTRAMUSCULAR | Status: AC
  Filled 2015-06-09: qty 2

## 2015-06-09 MED ORDER — STERILE WATER FOR INJECTION IV SOLN
INTRAVENOUS | Status: AC
  Administered 2015-06-09: 100 mL/h via INTRAVENOUS
  Filled 2015-06-09: qty 850

## 2015-06-09 MED ORDER — MIDAZOLAM HCL 5 MG/5ML IJ SOLN
INTRAMUSCULAR | Status: DC | PRN
  Administered 2015-06-09: 2 mg via INTRAVENOUS

## 2015-06-09 MED ORDER — LIDOCAINE HCL (CARDIAC) 20 MG/ML IV SOLN
INTRAVENOUS | Status: DC | PRN
  Administered 2015-06-09: 150 mg via INTRATRACHEAL
  Administered 2015-06-09: 80 mg via INTRATRACHEAL

## 2015-06-09 MED ORDER — FENTANYL CITRATE (PF) 250 MCG/5ML IJ SOLN
INTRAMUSCULAR | Status: DC | PRN
  Administered 2015-06-09: 100 ug via INTRAVENOUS
  Administered 2015-06-09: 150 ug via INTRAVENOUS

## 2015-06-09 MED ORDER — HEPARIN (PORCINE) IN NACL 100-0.45 UNIT/ML-% IJ SOLN
1300.0000 [IU]/h | INTRAMUSCULAR | Status: DC
  Administered 2015-06-09: 1300 [IU]/h via INTRAVENOUS
  Filled 2015-06-09 (×5): qty 250

## 2015-06-09 MED ORDER — ROCURONIUM BROMIDE 100 MG/10ML IV SOLN
INTRAVENOUS | Status: DC | PRN
  Administered 2015-06-09: 50 mg via INTRAVENOUS

## 2015-06-09 MED ORDER — ALBUTEROL SULFATE HFA 108 (90 BASE) MCG/ACT IN AERS
INHALATION_SPRAY | RESPIRATORY_TRACT | Status: DC | PRN
  Administered 2015-06-09: 3 via RESPIRATORY_TRACT

## 2015-06-09 MED ORDER — EPINEPHRINE HCL 0.1 MG/ML IJ SOSY
PREFILLED_SYRINGE | INTRAMUSCULAR | Status: DC | PRN
  Administered 2015-06-09 (×3): 5 mL via INTRAVENOUS
  Administered 2015-06-09 (×2): 10 mL via INTRAVENOUS
  Administered 2015-06-09: .4 mL via INTRAVENOUS

## 2015-06-09 MED ORDER — VECURONIUM BROMIDE 10 MG IV SOLR
INTRAVENOUS | Status: DC | PRN
  Administered 2015-06-09: 2 mg via INTRAVENOUS
  Administered 2015-06-09: 4 mg via INTRAVENOUS

## 2015-06-09 MED ORDER — IOPAMIDOL (ISOVUE-370) INJECTION 76%
100.0000 mL | Freq: Once | INTRAVENOUS | Status: AC | PRN
  Administered 2015-06-09: 100 mL via INTRAVENOUS

## 2015-06-09 MED ORDER — LIDOCAINE HCL (CARDIAC) 20 MG/ML IV SOLN
INTRAVENOUS | Status: AC
  Filled 2015-06-09: qty 5

## 2015-06-09 MED ORDER — PROPOFOL 10 MG/ML IV BOLUS
INTRAVENOUS | Status: AC
  Filled 2015-06-09: qty 20

## 2015-06-09 MED ORDER — SUCCINYLCHOLINE CHLORIDE 20 MG/ML IJ SOLN
INTRAMUSCULAR | Status: DC | PRN
  Administered 2015-06-09: 120 mg via INTRAVENOUS

## 2015-06-09 MED ORDER — HYDROMORPHONE HCL 1 MG/ML IJ SOLN
0.5000 mg | Freq: Once | INTRAMUSCULAR | Status: AC
  Administered 2015-06-09: 0.5 mg via INTRAVENOUS
  Filled 2015-06-09 (×2): qty 1

## 2015-06-09 MED ORDER — SUCCINYLCHOLINE CHLORIDE 20 MG/ML IJ SOLN
INTRAMUSCULAR | Status: AC
  Filled 2015-06-09: qty 1

## 2015-06-09 MED ORDER — NOREPINEPHRINE BITARTRATE 1 MG/ML IV SOLN
4000.0000 ug | INTRAVENOUS | Status: DC | PRN
  Administered 2015-06-09: 16 ug/min via INTRAVENOUS

## 2015-06-09 MED ORDER — SODIUM BICARBONATE 4.2 % IV SOLN
INTRAVENOUS | Status: DC | PRN
  Administered 2015-06-09 (×6): 50 meq via INTRAVENOUS

## 2015-06-09 MED ORDER — PROPOFOL 10 MG/ML IV BOLUS
INTRAVENOUS | Status: DC | PRN
  Administered 2015-06-09: 150 mg via INTRAVENOUS

## 2015-06-09 MED ORDER — FUROSEMIDE 10 MG/ML IJ SOLN
INTRAMUSCULAR | Status: AC
  Filled 2015-06-09: qty 2

## 2015-06-09 MED ORDER — CALCIUM CHLORIDE 10 % IV SOLN
INTRAVENOUS | Status: DC | PRN
  Administered 2015-06-09: 400 mg via INTRAVENOUS
  Administered 2015-06-09: 800 mg via INTRAVENOUS
  Administered 2015-06-09: 1000 mg via INTRAVENOUS
  Administered 2015-06-09: 200 mg via INTRAVENOUS

## 2015-06-09 MED ORDER — FENTANYL CITRATE (PF) 250 MCG/5ML IJ SOLN
INTRAMUSCULAR | Status: AC
  Filled 2015-06-09: qty 5

## 2015-06-09 MED ORDER — ALBUMIN HUMAN 5 % IV SOLN
INTRAVENOUS | Status: DC | PRN
  Administered 2015-06-09: 09:00:00 via INTRAVENOUS

## 2015-06-09 MED FILL — Medication: Qty: 1 | Status: AC

## 2015-06-09 SURGICAL SUPPLY — 70 items
BANDAGE ACE 4X5 VEL STRL LF (GAUZE/BANDAGES/DRESSINGS) ×2 IMPLANT
BANDAGE ESMARK 6X9 LF (GAUZE/BANDAGES/DRESSINGS) IMPLANT
BNDG CMPR 9X6 STRL LF SNTH (GAUZE/BANDAGES/DRESSINGS)
BNDG ESMARK 6X9 LF (GAUZE/BANDAGES/DRESSINGS)
BNDG GAUZE ELAST 4 BULKY (GAUZE/BANDAGES/DRESSINGS) ×2 IMPLANT
CANISTER SUCTION 2500CC (MISCELLANEOUS) ×4 IMPLANT
CANISTER WOUND CARE 500ML ATS (WOUND CARE) ×1 IMPLANT
CATH EMB 3FR 80CM (CATHETERS) ×1 IMPLANT
CATH EMB 4FR 80CM (CATHETERS) ×3 IMPLANT
CATH EMB 5FR 80CM (CATHETERS) ×1 IMPLANT
CATH TRIALYSIS 15CM 13FR CVD (IV SETS) ×1 IMPLANT
CLIP TI MEDIUM 24 (CLIP) ×4 IMPLANT
CLIP TI WIDE RED SMALL 24 (CLIP) ×4 IMPLANT
CONNECTOR Y ATS VAC SYSTEM (MISCELLANEOUS) ×1 IMPLANT
CONT SPEC 4OZ CLIKSEAL STRL BL (MISCELLANEOUS) ×1 IMPLANT
CUFF TOURNIQUET SINGLE 24IN (TOURNIQUET CUFF) IMPLANT
CUFF TOURNIQUET SINGLE 34IN LL (TOURNIQUET CUFF) IMPLANT
CUFF TOURNIQUET SINGLE 44IN (TOURNIQUET CUFF) IMPLANT
DECANTER SPIKE VIAL GLASS SM (MISCELLANEOUS) IMPLANT
DRAIN SNY 10X20 3/4 PERF (WOUND CARE) IMPLANT
DRAPE FOOT SWITCH (DRAPES) ×2 IMPLANT
DRAPE X-RAY CASS 24X20 (DRAPES) IMPLANT
DRSG COVADERM 4X8 (GAUZE/BANDAGES/DRESSINGS) IMPLANT
DRSG VAC ATS MED SENSATRAC (GAUZE/BANDAGES/DRESSINGS) ×2 IMPLANT
ELECT REM PT RETURN 9FT ADLT (ELECTROSURGICAL) ×4
ELECTRODE REM PT RTRN 9FT ADLT (ELECTROSURGICAL) ×3 IMPLANT
EVACUATOR SILICONE 100CC (DRAIN) IMPLANT
GLOVE BIO SURGEON STRL SZ7.5 (GLOVE) ×4 IMPLANT
GLOVE BIOGEL PI IND STRL 6.5 (GLOVE) IMPLANT
GLOVE BIOGEL PI IND STRL 7.0 (GLOVE) IMPLANT
GLOVE BIOGEL PI IND STRL 7.5 (GLOVE) IMPLANT
GLOVE BIOGEL PI IND STRL 8 (GLOVE) IMPLANT
GLOVE BIOGEL PI INDICATOR 6.5 (GLOVE) ×3
GLOVE BIOGEL PI INDICATOR 7.0 (GLOVE) ×1
GLOVE BIOGEL PI INDICATOR 7.5 (GLOVE) ×1
GLOVE BIOGEL PI INDICATOR 8 (GLOVE) ×1
GLOVE ECLIPSE 6.5 STRL STRAW (GLOVE) ×2 IMPLANT
GLOVE ECLIPSE 7.0 STRL STRAW (GLOVE) ×1 IMPLANT
GLOVE ECLIPSE 7.5 STRL STRAW (GLOVE) ×1 IMPLANT
GOWN STRL REUS W/ TWL LRG LVL3 (GOWN DISPOSABLE) ×9 IMPLANT
GOWN STRL REUS W/TWL LRG LVL3 (GOWN DISPOSABLE) ×16
KIT BASIN OR (CUSTOM PROCEDURE TRAY) ×4 IMPLANT
KIT ROOM TURNOVER OR (KITS) ×4 IMPLANT
LIQUID BAND (GAUZE/BANDAGES/DRESSINGS) ×2 IMPLANT
NS IRRIG 1000ML POUR BTL (IV SOLUTION) ×8 IMPLANT
PACK PERIPHERAL VASCULAR (CUSTOM PROCEDURE TRAY) ×4 IMPLANT
PAD ARMBOARD 7.5X6 YLW CONV (MISCELLANEOUS) ×8 IMPLANT
PADDING CAST COTTON 6X4 STRL (CAST SUPPLIES) IMPLANT
SET COLLECT BLD 21X3/4 12 (NEEDLE) IMPLANT
SPONGE SURGIFOAM ABS GEL 100 (HEMOSTASIS) IMPLANT
STAPLER VISISTAT 35W (STAPLE) ×2 IMPLANT
STOPCOCK 4 WAY LG BORE MALE ST (IV SETS) IMPLANT
SUT ETHILON 3 0 PS 1 (SUTURE) ×1 IMPLANT
SUT PROLENE 5 0 C 1 24 (SUTURE) ×5 IMPLANT
SUT PROLENE 6 0 CC (SUTURE) ×6 IMPLANT
SUT PROLENE 7 0 BV 1 (SUTURE) ×2 IMPLANT
SUT SILK 3 0 (SUTURE) ×4
SUT SILK 3-0 18XBRD TIE 12 (SUTURE) IMPLANT
SUT VIC AB 2-0 CTX 36 (SUTURE) ×3 IMPLANT
SUT VIC AB 2-0 SH 27 (SUTURE) ×16
SUT VIC AB 2-0 SH 27XBRD (SUTURE) IMPLANT
SUT VIC AB 3-0 SH 27 (SUTURE) ×8
SUT VIC AB 3-0 SH 27X BRD (SUTURE) ×3 IMPLANT
SUT VICRYL 4-0 PS2 18IN ABS (SUTURE) ×2 IMPLANT
SYR 3ML LL SCALE MARK (SYRINGE) ×6 IMPLANT
SYR TB 1ML LUER SLIP (SYRINGE) ×1 IMPLANT
TRAY FOLEY W/METER SILVER 16FR (SET/KITS/TRAYS/PACK) ×4 IMPLANT
TUBING EXTENTION W/L.L. (IV SETS) IMPLANT
UNDERPAD 30X30 INCONTINENT (UNDERPADS AND DIAPERS) ×4 IMPLANT
WATER STERILE IRR 1000ML POUR (IV SOLUTION) ×4 IMPLANT

## 2015-06-10 ENCOUNTER — Encounter (HOSPITAL_COMMUNITY): Payer: Self-pay | Admitting: Vascular Surgery

## 2015-06-10 LAB — CHROMOGRANIN A: Chromogranin A: 17 nmol/L — ABNORMAL HIGH (ref 0–5)

## 2015-06-16 NOTE — Progress Notes (Signed)
   19-Jun-2015 1403  Clinical Encounter Type  Visited With Family;Health care provider  Visit Type Initial;Death  Referral From Physician  Spiritual Encounters  Spiritual Needs Prayer;Grief support  Stress Factors  Family Stress Factors Loss   Chaplain responded to a death in the OR. Chaplain assisted the patient's family with hospitality, empathic listening, and relaying funeral home information [employer] in [place]) and next of kin contact information Amanda Cockayne, daughter, [phone]) to Patient Placement Dept. Spiritual Care services available as needed.   Jeri Lager, Chaplain 06-19-2015 2:06 PM

## 2015-06-16 NOTE — Consult Note (Signed)
Referring Physician: Dr Mamie Nick Patient name: Sherri Adams MRN: [id number] DOB: 1950/10/17 Sex: female  REASON FOR CONSULT: bilateral leg ischemia  HPI: Sherri Adams is a 65 y.o. female, with sudden onset of bilateral lower extremity paralysis at 4 am today. Pt had been admitted to [facility] for diarrhea.  She has no prior history of afib.  She currently has no sensation or motor function in her legs.  Her diarrhea is ongoing. Other medical problems include hyperlipidemia, diabetes, sleep apnea, obesity (prior gastric bypass). These have been stable. Former smoker quit 12 years ago.    Past Medical History  Diagnosis Date  . Thyroid disease   . Hyperlipidemia   . Sinus problem   . Colon polyp   . Arthritis   . Asthma     "perfumes, strong odors"  . Anxiety   . Depression   . 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 Sleep apnea     no cpap  . Family history of anesthesia complication     mother slow to wake  . Diabetes mellitus without complication ([place])   . Sleep apnea   . Cataracts, bilateral   . Skin cancer of face   . Vitamin D deficiency   . 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 History of radiation therapy     tonsils: 41 months of age  . Adenomatous polyps 9/15   Past Surgical History  Procedure Laterality Date  . Nasal sinus surgery      twice  . Thyroidectomy    . Abdominal hysterectomy    . Tubal ligation    . Tonsilectomy, adenoidectomy, bilateral myringotomy and tubes    . Arm plate      Plate in right arm. Bone was shattered after fall down flight of stairs.  . Cataract surgery Bilateral     right eye  . Gastric bypass      4 yrs ago weight around 250# now and steady  . Tonsillectomy  2000    and adenoids  . Colonoscopy with propofol N/A 11/07/2013    Procedure: COLONOSCOPY WITH PROPOFOL;  Surgeon: Garlan Fair, MD;  Location: [facility];  Service: Endoscopy;  Laterality: N/A;  . Mohs surgery      Family History    Problem Relation Age of Onset  . COPD Father   . Myasthenia gravis Father   . Cancer Father     colon  . Diabetes Father   . Heart disease Father   . Heart disease Mother     CAD  . Alzheimer's disease Mother   . CAD Mother   . Cancer Maternal Grandfather     Brain tumor  . Diabetes Sister   . Deep vein thrombosis Sister   . Obesity Sister   . Thyroid cancer Daughter   . Obesity Sister   . Hypertension Sister   . Obesity Sister   . Thyroid disease Sister   . Hypertension Sister   . Diabetes Sister     SOCIAL HISTORY: Social History   Social History  . Marital Status: Divorced    Spouse Name: N/A  . Number of Children: N/A  . Years of Education: N/A   Occupational History  . Not on file.   Social History Main Topics  . Smoking status: Former Smoker    Quit date: 10/02/2003  . Smokeless tobacco: Never Used  . Alcohol Use: 0.0 oz/week  0 Standard drinks or equivalent per week     Comment: very rare  . Drug Use: No  . Sexual Activity: Not Currently   Other Topics Concern  . Not on file   Social History Narrative   Divorced   4 adult children   9 grandchildren   Occupation: [employer]       Allergies  Allergen Reactions  . Zithromax [Azithromycin] Anaphylaxis and Itching  . Hydrocodone Itching  . Lipitor [Atorvastatin] Other (See Comments)    "makes me feel like something is crawling all over me"  . Lotensin [Benazepril Hcl] Other (See Comments)    Pt does not remember.   . Omeprazole Diarrhea  . Other Other (See Comments)    Ice Tea-Asthma attack.   . Zocor [Simvastatin] Other (See Comments)    Joint pain.    Current Facility-Administered Medications  Medication Dose Route Frequency Provider Last Rate Last Dose  . 0.9 %  sodium chloride infusion   Intravenous Continuous Robert Buccini, MD      . 0.9 % NaCl with KCl 40 mEq / L  infusion   Intravenous Continuous Eugenie Filler, MD 150 mL/hr at 11-Jun-2015 0056 150 mL/hr at  Jun 11, 2015 0056  . [MAR Hold] acetaminophen (TYLENOL) tablet 1,000 mg  1,000 mg Oral Q6H PRN Etta Quill, DO   1,000 mg at 06/08/15 0654  . [MAR Hold] acidophilus (RISAQUAD) capsule 1 capsule  1 capsule Oral BID Ronald Lobo, MD   1 capsule at 06/08/15 1812  . [MAR Hold] albuterol (PROVENTIL) (2.5 MG/3ML) 0.083% nebulizer solution 2.5 mg  2.5 mg Nebulization Q6H PRN Etta Quill, DO      . [MAR Hold] buPROPion (WELLBUTRIN XL) 24 hr tablet 300 mg  300 mg Oral Daily Etta Quill, DO   300 mg at 06/08/15 0900  . [MAR Hold] fluticasone (FLONASE) 50 MCG/ACT nasal spray 2 spray  2 spray Each Nare Daily PRN Etta Quill, DO      . heparin ADULT infusion 100 units/mL (25000 units/250 mL)  1,300 Units/hr Intravenous Continuous Nilda Simmer, RPH 13 mL/hr at 11-Jun-2015 0611 1,300 Units/hr at 06-11-2015 0611  . [MAR Hold] insulin aspart (novoLOG) injection 0-9 Units  0-9 Units Subcutaneous TID AC & HS Eugenie Filler, MD   1 Units at 06/08/15 2251  . [MAR Hold] insulin glargine (LANTUS) injection 20 Units  20 Units Subcutaneous QHS Eugenie Filler, MD   20 Units at 06/08/15 2227  . [MAR Hold] levothyroxine (SYNTHROID, LEVOTHROID) tablet 100 mcg  100 mcg Oral QAC breakfast Eugenie Filler, MD   100 mcg at 06/08/15 0900  . [MAR Hold] loperamide (IMODIUM) capsule 4 mg  4 mg Oral Q6H Ronald Lobo, MD   4 mg at 06/08/15 2227  . [MAR Hold] multivitamin liquid 15 mL  15 mL Oral Daily Etta Quill, DO   15 mL at 06/08/15 0900  . [MAR Hold] octreotide (SANDOSTATIN) injection 100 mcg  100 mcg Subcutaneous Q12H Ronald Lobo, MD   100 mcg at 06/08/15 2251  . [MAR Hold] ondansetron (ZOFRAN) 8 mg in sodium chloride 0.9 % 50 mL IVPB  8 mg Intravenous Q6H PRN Eugenie Filler, MD       Or  . Doug Sou Hold] ondansetron [facility]) tablet 8 mg  8 mg Oral Q6H PRN Eugenie Filler, MD   8 mg at 06/08/15 1628  . [MAR Hold] ondansetron (ZOFRAN) 8 mg in sodium chloride 0.9 % 50  mL IVPB  8 mg Intravenous Q8H  Eugenie Filler, MD   8 mg at 06/08/15 2227  . [MAR Hold] potassium chloride 10 mEq in 100 mL IVPB  10 mEq Intravenous Once Delora Fuel, MD   10 mEq at 05/20/2015 [id number]  . [MAR Hold] potassium chloride SA (K-DUR,KLOR-CON) CR tablet 40 mEq  40 mEq Oral TID Eugenie Filler, MD   40 mEq at 06/08/15 2227  . [MAR Hold] prochlorperazine (COMPAZINE) injection 10 mg  10 mg Intravenous Q6H PRN Eugenie Filler, MD   10 mg at 06/08/15 1716  . [MAR Hold] rosuvastatin (CRESTOR) tablet 20 mg  20 mg Oral q1800 Etta Quill, DO   20 mg at 06/08/15 1812  . [MAR Hold] saccharomyces boulardii (FLORASTOR) capsule 250 mg  250 mg Oral BID Ronald Lobo, MD   250 mg at 06/08/15 2227  . [MAR Hold] sodium chloride flush (NS) 0.9 % injection 3 mL  3 mL Intravenous Q12H Etta Quill, DO   3 mL at 06/08/15 2251    ROS:   General:  No weight loss, Fever, chills  HEENT: No recent headaches, no nasal bleeding, no visual changes, no sore throat  Neurologic: No dizziness, blackouts, seizures. No recent symptoms of stroke or mini- stroke. No recent episodes of slurred speech, or temporary blindness.  Cardiac: No recent episodes of chest pain/pressure, no shortness of breath at rest.  +shortness of breath with exertion.  Denies history of atrial fibrillation or irregular heartbeat  Vascular: No history of rest pain in feet.  No history of claudication.  No history of non-healing ulcer, No history of DVT   Pulmonary: No home oxygen, no productive cough, no hemoptysis,  No asthma or wheezing  Musculoskeletal:  [ ]  Arthritis, [ ]  Low back pain,  [ ]  Joint pain  Hematologic:No history of hypercoagulable state.  No history of easy bleeding.  No history of anemia  Gastrointestinal: No hematochezia or melena,  No gastroesophageal reflux, no trouble swallowing  Urinary: [ ]  chronic Kidney disease, [ ]  on HD - [ ]  MWF or [ ]  TTHS, [ ]  Burning with urination, [ ]  Frequent urination, [ ]  Difficulty urinating;   Skin: No  rashes  Psychological: No history of anxiety,  No history of depression   Physical Examination  Filed Vitals:   06/08/15 1818 06/08/15 2214 06-21-15 0419 21-Jun-2015 0633  BP: 121/58 104/60 152/68 129/53  Pulse:  88 111 85  Temp:  98.1 F (36.7 C) 98.8 F (37.1 C)   TempSrc:  Oral Oral   Resp:  22 35 18  Height:      Weight:      SpO2:  96% 96% 96%    Body mass index is 47.61 kg/(m^2).  General:  Alert and oriented, no acute distress HEENT: Normal Pulmonary: Clear to auscultation bilaterally Cardiac: Regular Rate and Rhythm sinus on monitor Abdomen: Soft, non-tender, non-distended, no mass, obese Skin: No rash, legs pale cool from waist down Extremity Pulses:  Absent femoral, dorsalis pedis, posterior tibial pulses bilaterally Musculoskeletal: No deformity or edema  Neurologic: Upper and lower extremity motor 5/5 and symmetric  DATA:  CTA images reviewed.  Thrombus left common iliac right common femoral no flow below these levels.  No aortic dissection  CBC    Component Value Date/Time   WBC 13.7* 06-21-2015 0437   RBC 4.41 06/21/15 0437   HGB 11.9* 2015/06/21 0722   HCT 35.0* Jun 21, 2015 0722   PLT 256 Jun 21, 2015 0437  MCV 79.1 15-Jun-2015 0437   MCH 26.5 06-15-15 0437   MCHC 33.5 06-15-2015 0437   RDW 16.3* June 15, 2015 0437   LYMPHSABS 0.7 05/27/2015 0420   MONOABS 0.8 05/28/2015 0420   EOSABS 0.1 05/27/2015 0420   BASOSABS 0.0 06/10/2015 0420     BMET    Component Value Date/Time   NA 142 Jun 15, 2015 0722   K 4.6 15-Jun-2015 0722   CL 114* 06-15-15 0437   CO2 20* June 15, 2015 0437   GLUCOSE 171* 06-15-15 0722   BUN 32* 06-15-15 0437   CREATININE 1.07* 15-Jun-2015 0437   CALCIUM 7.7* 06-15-15 0437   GFRNONAA 54* June 15, 2015 0437   GFRAA >60 2015/06/15 0437      ASSESSMENT/PLAN:  Bilateral lower extremity acute ischemia now almost 4 hrs by history.  Very high risk of limb loss due to ischemia time and presenting symptoms.  Also high risk for  wound complications due to obesity.  Will proceed emergently to OR for embolectomy and fasciotomies.  Risks benefits complications explained to family and pt.   Ruta Hinds, MD [facility] Office: [phone] Pager: [phone]

## 2015-06-16 NOTE — Significant Event (Signed)
Rapid Response Event Note Received a call from [facility], with a complaint of patient not being able to move her legs bilaterally. Overview: Time Called: [id number] Arrival Time: 0439 Event Type: Unknown  Initial Focused Assessment: BP 129/53, P 85, RR 18, 96% on room air. Patient unable to move both legs, Legs cool to touch, right femoral pulse was palpable but was unable to palpate any other lower extremities pulse even with the use of a doppler. MD was in room, and was made aware.  Interventions: Taken to radiology for a CT scan  Event Summary: Name of Physician Notified: Jennette Kettle DO at      at  Dr. Alcario Drought was already at patient's bedside on my arrival to room 1440. Patient transferred from CT to ICU bed. Order received for patient to be transferred to [facility].       Jari Favre Chibuzo

## 2015-06-16 NOTE — Anesthesia Postprocedure Evaluation (Signed)
Anesthesia Post Note  Patient: Sherri Adams  Procedure(s) Performed: Procedure(s) (LRB): BILATERAL FEMORAL EMBOLECTOMY  (Bilateral) BILATERAL FASCIOTOMY (Left)  Comments: Patient expired in or.     Last Vitals:  Filed Vitals:   Jun 30, 2015 0419 2015-06-30 0633  BP: 152/68 129/53  Pulse: 111 85  Temp: 37.1 C   Resp: 35 18    Last Pain:  Filed Vitals:   06-30-2015 0634  PainSc: 0-No pain                 Olney Monier,ZACH

## 2015-06-16 NOTE — Significant Event (Addendum)
Sign out given to my partner Dr. Thereasa Solo over at [facility] who will be picking the patient up after arrival / after emergency surgery for our (hospitalist) service.

## 2015-06-16 NOTE — Progress Notes (Signed)
Spoke with Dr Oneida Alar per Dr Alcario Drought on placement of patient.  Patient has been assigned bed on 2S, [facility] will transfer patient to OR holding area at [facility] per Dr Oneida Alar request as long as they accepted.  I spoke with the OR and [facility] and was instructed to have [facility] bring patient straight to OR holding area.  Report called to [facility] by bedside RN.  [facility]  enroute now to transfer patient.

## 2015-06-16 NOTE — Progress Notes (Signed)
ANTICOAGULATION CONSULT NOTE - Initial Consult  Pharmacy Consult for Heparin Indication: Acute bilateral proximal occlusions  Allergies  Allergen Reactions  . Zithromax [Azithromycin] Anaphylaxis and Itching  . Hydrocodone Itching  . Lipitor [Atorvastatin] Other (See Comments)    "makes me feel like something is crawling all over me"  . Lotensin [Benazepril Hcl] Other (See Comments)    Pt does not remember.   . Omeprazole Diarrhea  . Other Other (See Comments)    Ice Tea-Asthma attack.   . Zocor [Simvastatin] Other (See Comments)    Joint pain.    Patient Measurements: Height: 5\' 2"  (157.5 cm) Weight: 260 lb 5.8 oz (118.1 kg) IBW/kg (Calculated) : 50.1 Heparin Dosing Weight: 79 kg  Vital Signs: Temp: 98.8 F (37.1 C) (04/24 0419) Temp Source: Oral (04/24 0419) BP: 152/68 mmHg (04/24 0419) Pulse Rate: 111 (04/24 0419)  Labs:  Recent Labs  06/07/15 0502 06/08/15 0510 06/08/15 1051 06/08/15 1940 Jun 10, 2015 0437  HGB 12.3 12.0  --   --  11.7*  HCT 36.0 36.0  --   --  34.9*  PLT 329 296  --   --  256  LABPROT  --   --   --   --  16.8*  INR  --   --   --   --  1.40  CREATININE 1.03* 1.81* 1.67* 1.19* 1.07*    Estimated Creatinine Clearance: 66.5 mL/min (by C-G formula based on Cr of 1.07).   Medical History: Past Medical History  Diagnosis Date  . Thyroid disease   . Hyperlipidemia   . Sinus problem   . Colon polyp   . Arthritis   . Asthma     "perfumes, strong odors"  . Anxiety   . Depression   . 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 Sleep apnea     no cpap  . Family history of anesthesia complication     mother slow to wake  . Diabetes mellitus without complication ([place])   . Sleep apnea   . Cataracts, bilateral   . Skin cancer of face   . Vitamin D deficiency   . 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 History of radiation therapy     tonsils: 32 months of age  . Adenomatous polyps 9/15    Medications:  Scheduled:  . acidophilus   1 capsule Oral BID  . buPROPion  300 mg Oral Daily  .  HYDROmorphone (DILAUDID) injection  0.5 mg Intravenous Once  . insulin aspart  0-9 Units Subcutaneous TID AC & HS  . insulin glargine  20 Units Subcutaneous QHS  . levothyroxine  100 mcg Oral QAC breakfast  . loperamide  4 mg Oral Q6H  . multivitamin  15 mL Oral Daily  . octreotide  100 mcg Subcutaneous Q12H  . ondansetron (ZOFRAN) IV  8 mg Intravenous Q8H  . potassium chloride  10 mEq Intravenous Once  . potassium chloride  40 mEq Oral TID  . rosuvastatin  20 mg Oral q1800  . saccharomyces boulardii  250 mg Oral BID  . sodium chloride flush  3 mL Intravenous Q12H   Infusions:  . sodium chloride    . 0.9 % NaCl with KCl 40 mEq / L 150 mL/hr (Jun 10, 2015 0056)  . heparin      Assessment:  65 yr female admitted on 4/21 for intractable diarrhea.  Plans for endoscopy this morning.  Patient has been on Heparin 5000 units sq q8h  for VTE prophylaxis.  This morning pt reported bilateral lower extremity pain, weakness.  Legs cool to the touch.  CTAngio shows acute bilateral occlusion of iliac artery and femoral artery  Pharmacy consulted to begin IV heparin  Plan to transfer to [facility] -  for intervention  Goal of Therapy:  Heparin level 0.3-0.7 units/ml Monitor platelets by anticoagulation protocol: Yes   Plan:   D/C heparin 5000 units sq q8h  Begin IV heparin @ 1300 units/hr  Check heparin level 6 hr after hepartin started, if pt not gone to surgery   Everette Rank, PharmD 06-25-15,6:07 AM

## 2015-06-16 NOTE — Anesthesia Preprocedure Evaluation (Addendum)
Anesthesia Evaluation  Patient identified by MRN, date of birth, ID band Patient awake    Airway Mallampati: III  TM Distance: <3 FB Neck ROM: Full    Dental   Pulmonary asthma , sleep apnea , former smoker,     + decreased breath sounds      Cardiovascular hypertension,  Rhythm:Regular Rate:Normal     Neuro/Psych    GI/Hepatic Recent gastric bypass surg   Endo/Other  diabetesMorbid obesity  Renal/GU      Musculoskeletal  (+) Arthritis ,   Abdominal (+) + obese,   Peds  Hematology   Anesthesia Other Findings   Reproductive/Obstetrics                           Anesthesia Physical Anesthesia Plan  ASA: IV and emergent  Anesthesia Plan: General   Post-op Pain Management:    Induction: Rapid sequence  Airway Management Planned: Oral ETT  Additional Equipment:   Intra-op Plan:   Post-operative Plan: Possible Post-op intubation/ventilation and Extubation in OR  Informed Consent:   Plan Discussed with: CRNA and Surgeon  Anesthesia Plan Comments:         Anesthesia Quick Evaluation

## 2015-06-16 NOTE — Transfer of Care (Signed)
Patient expired in OR.

## 2015-06-16 NOTE — Anesthesia Procedure Notes (Signed)
Procedure Name: Intubation Date/Time: 2015-06-14 7:56 AM Performed by: Mariea Clonts Pre-anesthesia Checklist: Emergency Drugs available, Patient identified, Timeout performed, Suction available and Patient being monitored Patient Re-evaluated:Patient Re-evaluated prior to inductionOxygen Delivery Method: Circle system utilized Preoxygenation: Pre-oxygenation with 100% oxygen Intubation Type: IV induction, Cricoid Pressure applied and Rapid sequence Laryngoscope Size: Miller and 2 Grade View: Grade I Tube type: Subglottic suction tube Tube size: 7.5 mm Number of attempts: 1 Placement Confirmation: positive ETCO2,  ETT inserted through vocal cords under direct vision and breath sounds checked- equal and bilateral Tube secured with: Tape Dental Injury: Teeth and Oropharynx as per pre-operative assessment

## 2015-06-16 NOTE — OR Nursing (Signed)
Starting CPR with Compressions at 11:10, called time of death at 11:35. See anesthesia note.

## 2015-06-16 NOTE — Progress Notes (Signed)
Patient used BSC around 0400 am with NT and after getting back to bed called for RN to come to the room. Patient complaining of being unable to move her legs and extreme weakness, pain and burning in bilateral legs.  Feet cool to touch and pulses very weak.  Notified NP on call who stated she would get attending to come assess patient. Dr Alcario Drought arrived to room and Rapid Response RN also called to assess patient.

## 2015-06-16 NOTE — Significant Event (Addendum)
Responded to patient bedside at 4:30 AM due to acute onset BLE weakness and severe pain.  Patient had beginning of mottling of BLE.  Pale cold pulseless B feet, B numbness and loss of sensation as well, got emergent CTA abd/pelvis, BLE runoff.  Spoke with Dr. Pascal Lux with radiology.  Patient has acute B proximal occlusions ([place] level on L, common femoral on R).  Spoke with Dr. Oneida Alar who says patient needs to go emergently to [facility] for intervention.  He asks for heparin gtt.  Have ordered the above.  Bed request now going through.  Will call my partners at [facility] to let them know.  Addendum: corrected left and R occlusion levels based on CT results.

## 2015-06-16 NOTE — OR Nursing (Signed)
Diaylsis catheter insertion per MD Fields, at 11:22, patient potassium warrant intra-op catheter placement. Nephrology consulted.

## 2015-06-16 NOTE — Op Note (Addendum)
Procedure: Left iliofemoral embolectomy with 4 compartment fasciotomy, right femoral embolectomy with 4 compartment fasciotomy, insertion of left subclavian Trialysis catheter  Preoperative diagnosis: Bilateral lower extremity acute ischemia  Postoperative diagnosis: Same  Anesthesia: Gen.  Assistant: Gerri Lins PA-C  Operative findings: #1 acute thrombus left iliofemoral region with severely ischemic muscle compartments lower extremity                                 #2 acute thrombus right iliofemoral region with severely ischemic muscle compartments lower extremity                                 #3 hyperkalemic cardiac arrest  Indications: Patient is a 65 year old female who had acute onset of paralysis and loss of sensation in her lower extremities at approximate 4 AM this morning. She was transferred from [facility] after a CT angiogram showed bilateral iliofemoral emboli at 5:30 this morning. The patient arrived at the [facility] preop holding area at 7:30 this morning. She was emergently taken to the operating room for revascularization.  Specimens: Embolic material left and right leg  Operative details: After obtaining informed consent, the patient was taken to the operating room. The patient was placed in supine position operating table. After induction of general anesthesia and endotracheal intubation the patient was prepped and draped in usual sterile fashion from the umbilicus down to the toes. Next an oblique incision was made in the left groin and carried down through the saphenous tissues down to level left common femoral artery. This had no pulse within it. The common femoral artery superficial femoral artery and profunda femoris arteries were all dissected free circumferentially and vessel loops placed around these. A transverse arteriotomy was then made in the left common femoral artery. There was fresh embolic material within the artery. #4 and 5 Fogarty catheters  were passed proximally up into the left iliofemoral system and a large amount of embolic material was retrieved. This was sent to pathology as a specimen. There is excellent arterial inflow at this point. I then proceeded to pass #3 and #4 Fogarty catheters down the superficial femoral and profunda femoris arteries in the left leg. Again large amounts of thrombus was retrieved. There was then backbleeding from both of these arteries. The blood backbleeding from the superficial femoral artery was very dark in color. Approximately using a running 6-0 Prolene suture. Just prior completion anastomosis was forebled backbled and thoroughly flushed. Anastomosis was secured Vesseloops released there was pulsatile flow in the artery immediately. The patient did not immediately restore Doppler signals to the left foot; however, the toes began to pink up immediately. I then proceeded to perform a 4 compartment fasciotomy in the left leg. Medial and lateral leg incisions were made to decompress the anterior posterior lateral and posterior deep compartments. None of the muscle tissue at either of these 4 compartments was contractile on cautery stimulation. However the muscle was pink in color.  At this point attention was then turned to the right leg. In similar fashion a oblique incision was made in the right groin and carried into the saphenous tissues down to level of the right common femoral artery. This did have a pulse within it proximally but not at the femoral bifurcation. Vessel loops were placed around the common femoral superficial femoral and profunda femoris arteries. A transverse  arteriotomy was made in the common femoral artery just above the bifurcation. #5 and 4 Fogarty catheters were passed proximally and some embolic material was retrieved there was then excellent arterial inflow. #3 and #4 Fogarty catheters were then passed distally.  During this portion of the case the patient began to exhibit symptoms of  hyperkalemia on her EKG tracing. She did not really manifest hypotension. However we gave her D50 and insulin as well as calcium for protection.  Patient was also given increased volume of fluids to encourage diuresis and also given Lasix. I was able to retrieve more clot from the superficial femoral and profunda femoris arteries. These were both thoroughly flushed with heparinized saline. There was a large amount of embolic material in the profunda as well as some of the superficial femoral artery as well. At this point it was noted there is a small elevated intimal flap along the proximal SFA and this was tacked down with several 7-0 Prolene sutures. I decided that a patch angioplasty would be necessary to make sure that we had reasonable flow through the superficial femoral artery. A segment of saphenous vein was harvested from the medial portion of the leg and the vein ligated proximal and distally and reversed. It was opened longitudinally and then sewn on as a patch angioplasty using a running 6-0 Prolene suture. Just prior completion anastomosis was also forebled backbled and thoroughly flushed. Samples of embolic material from both legs were sent to pathology as specimen. The patient had continued to have problems with hyperkalemia during this portion of the case. I proceeded to do a 4 compartment fasciotomy of the right leg. As we were starting to close the groins the patient went into a sign wave type rhythm and eventually asystole. CPR was commenced. I spoke with the nephrology service about trying to get emergent dialysis on the patient. I also placed a left subclavian dialysis catheter using a modified Seldinger technique. This was thoroughly flushed with heparinized saline. We continued to do CPR with multiple rounds of drugs for approximately 30-40 minutes. The patient was still asystolic. Presumably this was secondary to hyperkalemia from prolonged ischemia both lower extremities. At this point I  spoke with Dr. Orene Desanctis from anesthesia and we agreed to not prolong the code further. The patient then expired.  Ruta Hinds, MD [facility] Office: [phone] Pager: [phone]

## 2015-06-16 DEATH — deceased

## 2015-06-26 DIAGNOSIS — E875 Hyperkalemia: Secondary | ICD-10-CM | POA: Diagnosis not present

## 2015-06-26 DIAGNOSIS — I998 Other disorder of circulatory system: Secondary | ICD-10-CM | POA: Diagnosis not present

## 2015-06-26 DIAGNOSIS — I4901 Ventricular fibrillation: Secondary | ICD-10-CM

## 2015-06-26 DIAGNOSIS — I70229 Atherosclerosis of native arteries of extremities with rest pain, unspecified extremity: Secondary | ICD-10-CM | POA: Diagnosis not present

## 2015-06-26 DIAGNOSIS — I469 Cardiac arrest, cause unspecified: Secondary | ICD-10-CM | POA: Diagnosis not present

## 2015-07-17 NOTE — Discharge Summary (Signed)
Death Summary  Sherri Adams [id number] DOB: November 11, 1950 DOA: 06-22-15  PCP: Vidal Schwalbe, MD PCP/Office notified:   Admit date: 22-Jun-2015 Date of Death: 06-25-15  Final Diagnoses:  Principal Problem:   Cardiac arrest with ventricular fibrillation ([place]): secondary to hyperkalemia Active Problems:   Hyperkalemia   Critical lower limb ischemia   Nausea vomiting and diarrhea   Gastric bypass status for obesity   Type 2 diabetes mellitus with hyperglycemia, with long-term current use of insulin (HCC)   Intractable diarrhea   Metabolic acidosis, NAG, bicarbonate losses   Hypokalemia   Low bicarbonate level   Nausea with vomiting   Thyroid activity decreased     History of present illness:  Per Dr Mervyn Gay Mazzola is a 65 y.o. female with medical history significant of gastric bypass surgery, DM. Patient presented to the ED with a 9 day history of intractable diarrhea, as well as some nausea and vomiting. Is able to keep liquids down but they "go right through me". Zofran provided minimal relief for nausea, Imodium provided no relief for diarrhea.  No recent ABx use nor h/o C.Diff. Patient was seen by GI Dr. Wynetta Emery and scheduled for endoscopy and colonoscopy next week. She was seen in the ED yesterday, given hydration and sent home.  ED Course: Patient's lab work demonstrated hypokalemia, NAG metabolic acidosis, elevated creatinine compared to baseline. IV hydration and electrolyte replacement was started and admission requested due to failed outpatient management.    Hospital Course:  #1 cardiac arrest secondary to hyperkalemia secondary to bilateral lower extremity acute ischemia Patient was admitted with nausea vomiting and numerous bowel movements with diarrhea and noted to have electrolyte abnormalities with severe hypokalemia. Patient was admitted to telemetry and potassium repleted. Patient was placed on IV fluids and analgesics as well as supportive care  probiotics. C. difficile PCR which was obtained was negative. GI pathogen panel obtained was negative. TSH was decreased and Synthroid dose adjusted appropriately. GI was consulted and followed the patient throughout the hospitalization and had recommended and scheduled patient for upper endoscopy and colonoscopy on Monday, 2015/06/25. On the morning of 06-25-2015 hospitalists was called by nursing that at 4:30 AM patient had acute onset of bilateral lower extremity weakness with severe pain and multinodular lower extremities. Patient was noted to be pulseless on bilateral feet with bilateral numbness and loss of sensation. Emergent CT angiogram of abdomen and pelvis with bilateral lower extremity runoff was obtained and it was noted that patient had acute bilateral proximal occlusions at the level of the left common iliac arteries and right common femoral artery with no collateral flow seen in the arterial phase. Vascular surgery was consulted and patient was seen in consultation by Dr. Oneida Alar who felt patient had bilateral lower extremity acute ischemia with very high risk of limb loss due to ischemia time of presenting symptoms. Patient was taken emergently to the operating room for embolectomy and fasciotomies. During surgery patient was noted to have continued problems with hyper kalemia and noted to go into a sign wave type rhythm and eventually asystole. CPR was commenced and nephrology service contacted for emergent dialysis. Patient had multiple rounds of drugs and CPR for approximately 30-40 minutes and remained asystolic. Patient was subsequently pronounced dead. It was felt patient presumably went into cardiac arrest secondary to hyperkalemia from prolonged ischemia of both lower extremities. May her soul rest in peace.  Time: 30 mins  Signed:  Kemani Heidel  [facility] 06/26/2015, 11:54 AM

## 2016-06-03 IMAGING — CT CT ANGIO AOBIFEM WO/W CM
2 of 5 series · 15 of 46 positions shown, 18 images · IV contrast (isovue)
Comparison: None.

CLINICAL DATA: Increasing pain and discoloration of the bilateral
legs with loss of pulses in both feet acutely.

EXAM:
CT ANGIOGRAPHY OF ABDOMINAL AORTA WITH ILIOFEMORAL RUNOFF
TECHNIQUE: Multidetector CT imaging of the abdomen, pelvis and lower
extremities was performed using the standard protocol during bolus
administration of intravenous contrast. Multiplanar CT image
reconstructions and MIPs were obtained to evaluate the vascular
anatomy.
CONTRAST:  100 cc Isovue 370 intravenous

[Series 5: arterial · axial · arterial · 0.98mm/px · z∈[+250,+1254]mm · 12 of 556 slices shown, 15 images]
[im 36/556  soft-tissue]
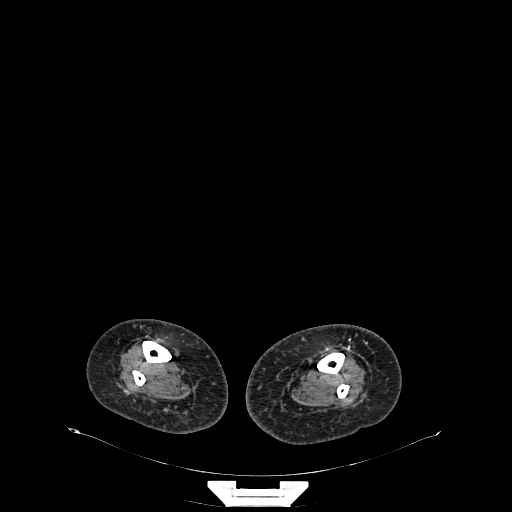
[im 36/556  bone]
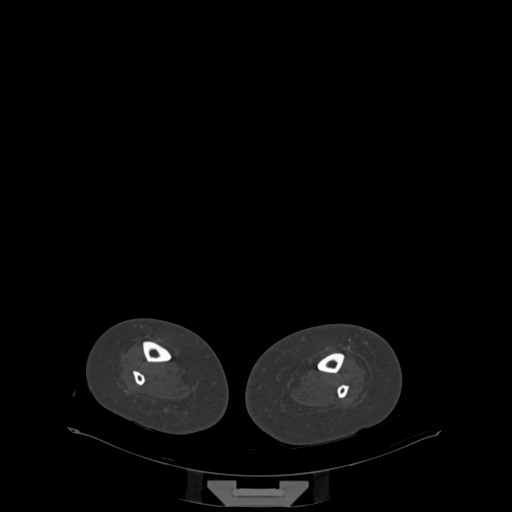
[im 108/556  soft-tissue]
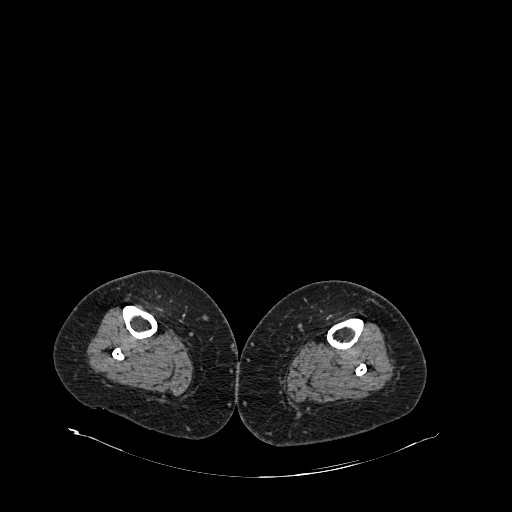
[im 162/556  soft-tissue]
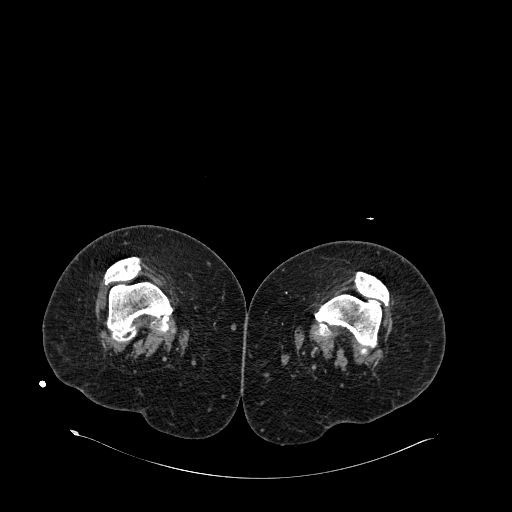
[im 215/556  soft-tissue]
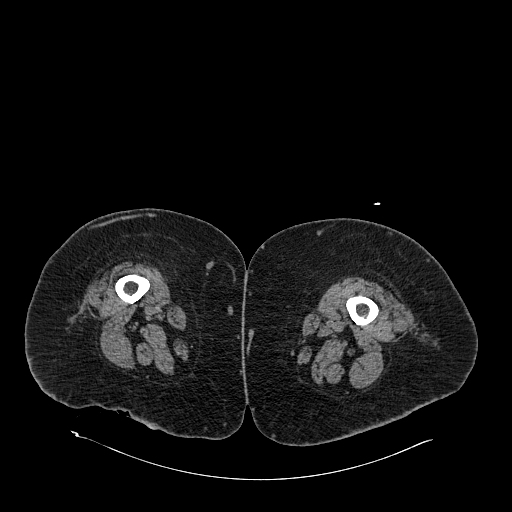
[im 287/556  soft-tissue]
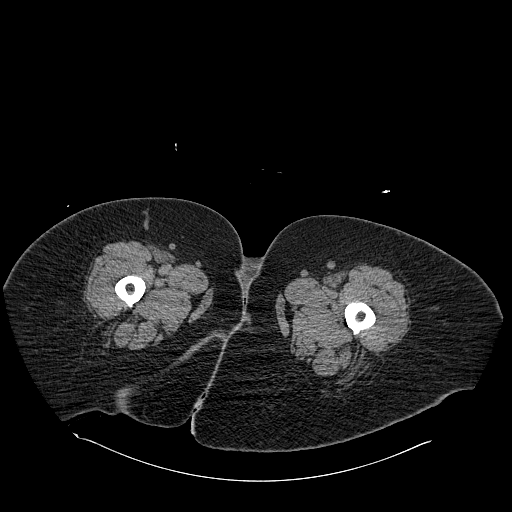
[im 341/556  soft-tissue]
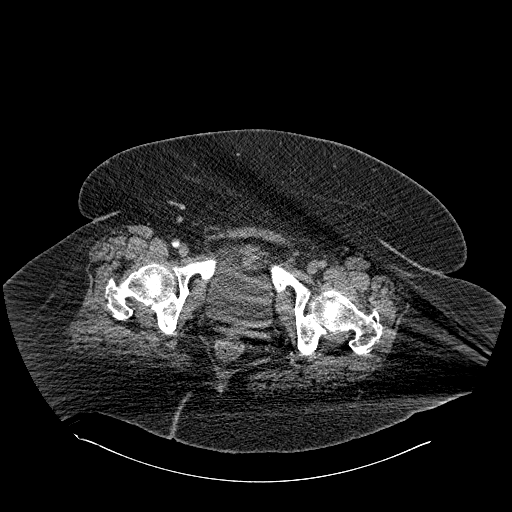
[im 394/556  soft-tissue]
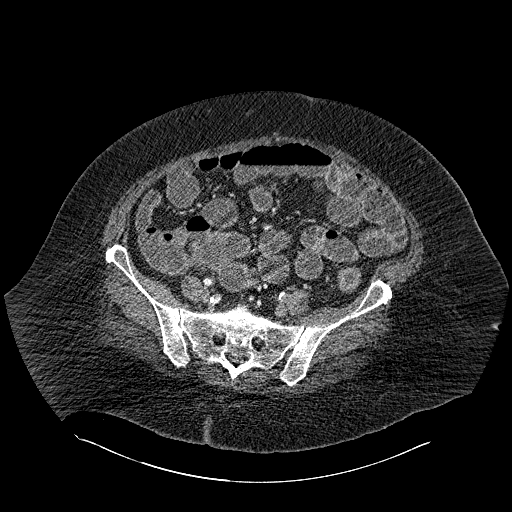
[im 466/556  soft-tissue]
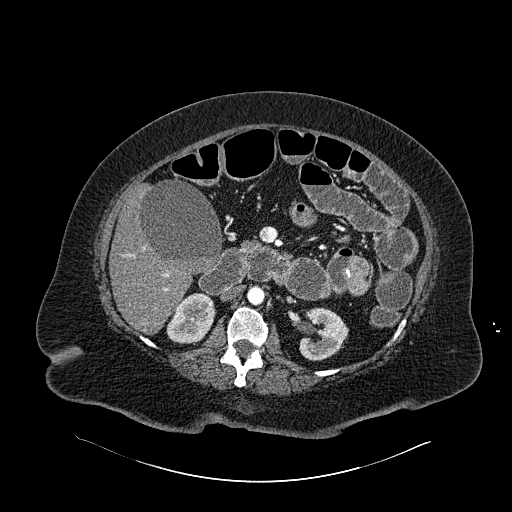
[im 484/556  lung]
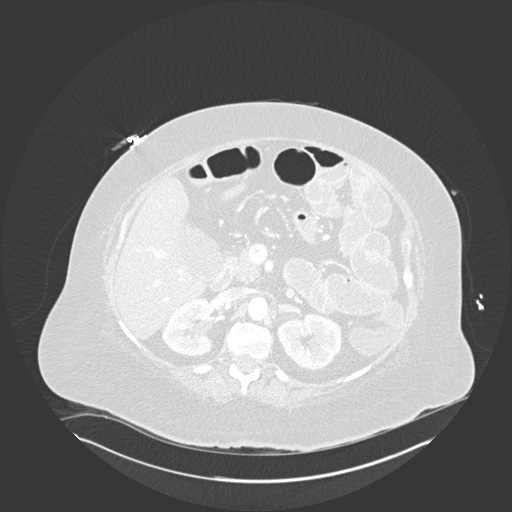
[im 502/556  lung]
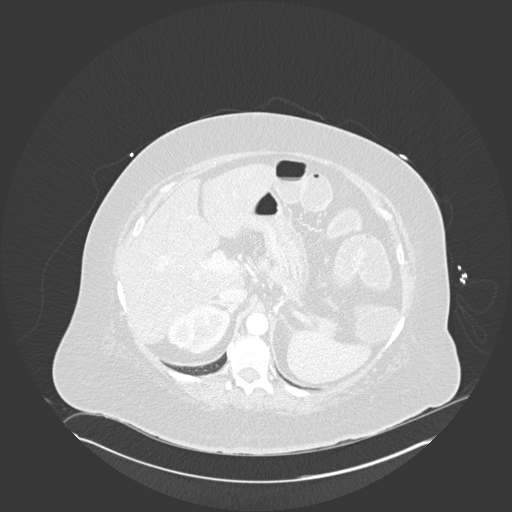
[im 520/556  soft-tissue]
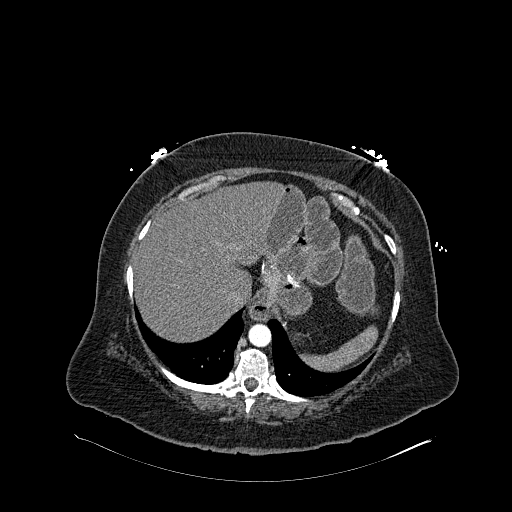
[im 520/556  lung]
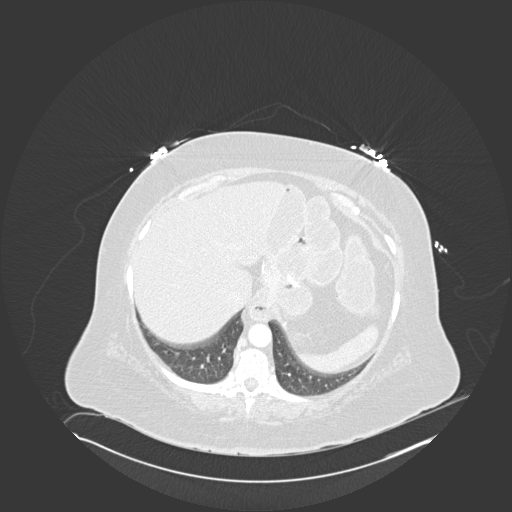
[im 520/556  bone]
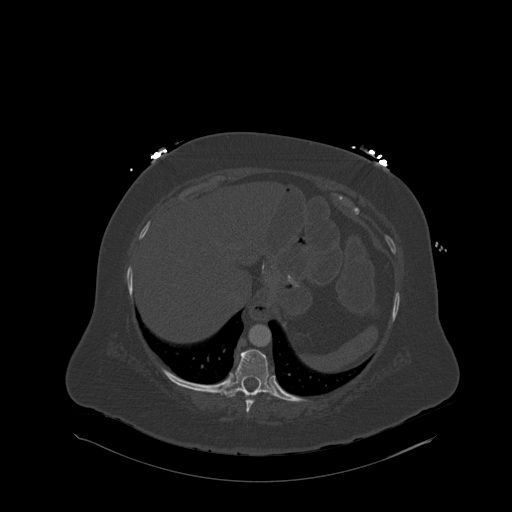
[im 538/556  lung]
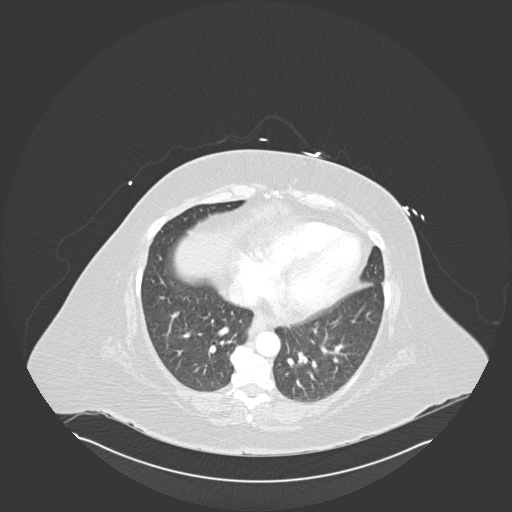

[Series 602: <mpr thick range> · coronal · 2.17mm/px · 3 of 157 slices shown]
[im 40/157  soft-tissue]
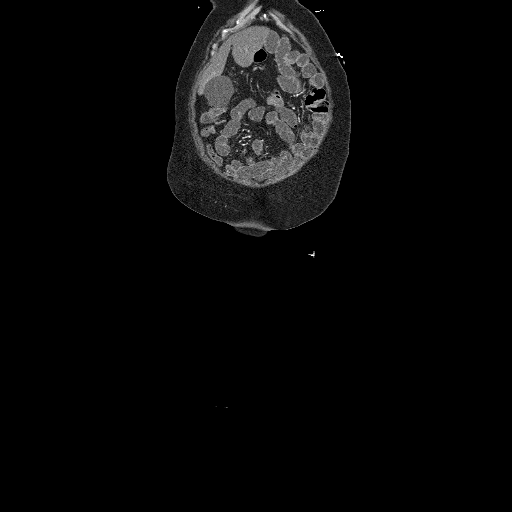
[im 79/157  soft-tissue]
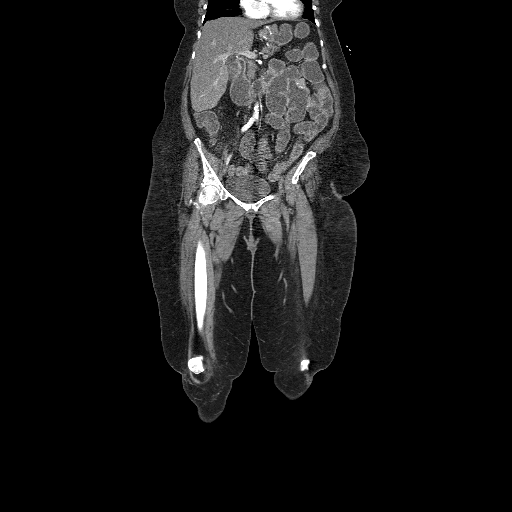
[im 118/157  soft-tissue]
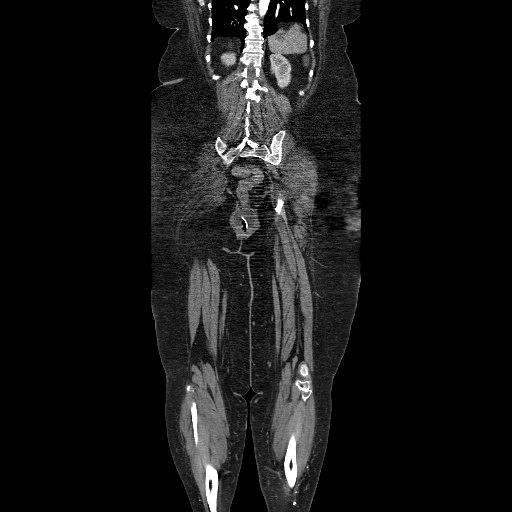

[15 of 46 positions shown; findings below may reference images not displayed]

FINDINGS: Aorta: Atheromatous but patent and non aneurysmal. Atherosclerosis
at the ostia of the celiac axis and SMA. Celiac ostium stenosis is
moderate.

Right Lower Extremity: Abrupt cessation of contrast at the right
common femoral artery with no downstream flow in the arterial phase.
The filling defect has a straight upper margin and favors embolus.
There is no collateral compensation. The hypogastric artery is
occluded.

Left Lower Extremity: Occluded common iliac artery with brief
reconstitution near the upper common femoral artery from small
collateral. No downstream flow in the arterial phase. The iliac
filling defect also has straight margins suggestive of emboli/acute
thrombosis. Hypogastric artery is occluded.

Lower chest and abdominal wall: No embolic source is seen in the
heart.

Hepatobiliary: Probable hepatic steatosis. No focal mass.Layering
calculi with distention but no inflammatory changes. Negative common
bile duct

Pancreas: Unremarkable.

Spleen: Unremarkable.

Adrenals/Urinary Tract: Negative adrenals. No hydronephrosis or
stone. Unremarkable bladder.

Reproductive:Hysterectomy with negative adnexa

Stomach/Bowel: No obstruction. History of diarrhea with distal
colonic fluid. No convincing bowel wall thickening. Status post
gastric bypass without obstruction. Rectal tube present

Vascular/Lymphatic: No acute vascular abnormality. No mass or
adenopathy.

Peritoneal: No ascites or pneumoperitoneum.

Musculoskeletal: No acute finding. Bilateral knee osteoarthritis.
Bilateral subcutaneous shin/calf reticulation and calcification,
favor stasis dermatitis.

Critical Value/emergent results were called by telephone at the time
of interpretation on 06/09/2015 at [DATE] to Dr. RELINDAS AUAD , who
verbally acknowledged these results.

Review of the MIP images confirms the above findings.
IMPRESSION: 1. Acute appearing occlusion of the right common femoral and left
common iliac arteries with no collateral flow seen in the arterial
phase.
2. Cholelithiasis or gallbladder sludge. The gallbladder is
distended but there is no inflammatory finding to suggest acute
cholecystitis.
.
